# Patient Record
Sex: Female | Born: 1990 | Hispanic: No | Marital: Married | Smoking: Never smoker
Health system: Southern US, Community
[De-identification: ages and names within clinical notes are randomized; demographics above are authoritative.]

## PROBLEM LIST (undated history)

## (undated) ENCOUNTER — Inpatient Hospital Stay (HOSPITAL_COMMUNITY): Payer: Self-pay

## (undated) DIAGNOSIS — F329 Major depressive disorder, single episode, unspecified: Secondary | ICD-10-CM

## (undated) DIAGNOSIS — F99 Mental disorder, not otherwise specified: Secondary | ICD-10-CM

## (undated) DIAGNOSIS — F32A Depression, unspecified: Secondary | ICD-10-CM

## (undated) DIAGNOSIS — Z789 Other specified health status: Secondary | ICD-10-CM

## (undated) DIAGNOSIS — M419 Scoliosis, unspecified: Secondary | ICD-10-CM

## (undated) HISTORY — DX: Mental disorder, not otherwise specified: F99

## (undated) HISTORY — PX: NO PAST SURGERIES: SHX2092

## (undated) HISTORY — DX: Depression, unspecified: F32.A

## (undated) HISTORY — DX: Major depressive disorder, single episode, unspecified: F32.9

---
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
## 2009-04-21 ENCOUNTER — Inpatient Hospital Stay (HOSPITAL_COMMUNITY): Admission: AD | Admit: 2009-04-21 | Discharge: 2009-04-21 | Payer: Self-pay | Admitting: Obstetrics and Gynecology

## 2009-08-27 ENCOUNTER — Inpatient Hospital Stay (HOSPITAL_COMMUNITY): Admission: AD | Admit: 2009-08-27 | Discharge: 2009-08-28 | Payer: Self-pay | Admitting: Obstetrics and Gynecology

## 2009-09-27 ENCOUNTER — Inpatient Hospital Stay (HOSPITAL_COMMUNITY): Admission: AD | Admit: 2009-09-27 | Discharge: 2009-09-27 | Payer: Self-pay | Admitting: Obstetrics and Gynecology

## 2009-10-11 ENCOUNTER — Inpatient Hospital Stay (HOSPITAL_COMMUNITY): Admission: AD | Admit: 2009-10-11 | Discharge: 2009-10-11 | Payer: Self-pay | Admitting: Obstetrics and Gynecology

## 2009-10-17 ENCOUNTER — Inpatient Hospital Stay (HOSPITAL_COMMUNITY): Admission: AD | Admit: 2009-10-17 | Discharge: 2009-10-20 | Payer: Self-pay | Admitting: Obstetrics and Gynecology

## 2010-02-20 ENCOUNTER — Inpatient Hospital Stay (HOSPITAL_COMMUNITY)
Admission: AD | Admit: 2010-02-20 | Discharge: 2010-02-20 | Payer: Self-pay | Source: Home / Self Care | Admitting: Obstetrics & Gynecology

## 2010-06-12 LAB — CBC
HCT: 39.3 % (ref 36.0–46.0)
Hemoglobin: 13.3 g/dL (ref 12.0–15.0)
MCH: 31.8 pg (ref 26.0–34.0)
MCV: 94 fL (ref 78.0–100.0)
Platelets: 348 10*3/uL (ref 150–400)
RBC: 4.18 MIL/uL (ref 3.87–5.11)

## 2010-06-12 LAB — URINALYSIS, ROUTINE W REFLEX MICROSCOPIC
Hgb urine dipstick: NEGATIVE
Specific Gravity, Urine: 1.02 (ref 1.005–1.030)
Urobilinogen, UA: 0.2 mg/dL (ref 0.0–1.0)

## 2010-06-12 LAB — COMPREHENSIVE METABOLIC PANEL
Alkaline Phosphatase: 80 U/L (ref 39–117)
BUN: 9 mg/dL (ref 6–23)
CO2: 25 mEq/L (ref 19–32)
Chloride: 105 mEq/L (ref 96–112)
Creatinine, Ser: 0.74 mg/dL (ref 0.4–1.2)
GFR calc non Af Amer: >60 mL/min (ref >60)
Glucose, Bld: 82 mg/dL (ref 70–99)
Total Bilirubin: 0.6 mg/dL (ref 0.3–1.2)

## 2010-06-12 LAB — POCT PREGNANCY, URINE: Preg Test, Ur: NEGATIVE

## 2010-06-12 LAB — GC/CHLAMYDIA PROBE AMP, GENITAL: GC Probe Amp, Genital: NEGATIVE

## 2010-06-12 LAB — WET PREP, GENITAL: Trich, Wet Prep: NONE SEEN

## 2010-06-16 LAB — GLUCOSE, RANDOM: Glucose, Bld: 99 mg/dL (ref 70–99)

## 2010-06-16 LAB — RPR: RPR Ser Ql: NONREACTIVE

## 2010-06-16 LAB — CBC
Hemoglobin: 11.7 g/dL — ABNORMAL LOW (ref 12.0–15.0)
Hemoglobin: 13.2 g/dL (ref 12.0–15.0)
Platelets: 212 10*3/uL (ref 150–400)
Platelets: 270 10*3/uL (ref 150–400)
RBC: 3.58 MIL/uL — ABNORMAL LOW (ref 3.87–5.11)
RBC: 4.08 MIL/uL (ref 3.87–5.11)
WBC: 11.6 10*3/uL — ABNORMAL HIGH (ref 4.0–10.5)

## 2010-06-17 LAB — URINALYSIS, ROUTINE W REFLEX MICROSCOPIC
Bilirubin Urine: NEGATIVE
Glucose, UA: NEGATIVE mg/dL
Hgb urine dipstick: NEGATIVE
Specific Gravity, Urine: 1.02 (ref 1.005–1.030)
Urobilinogen, UA: 0.2 mg/dL (ref 0.0–1.0)

## 2010-06-17 LAB — URINE MICROSCOPIC-ADD ON

## 2010-06-17 LAB — STREP B DNA PROBE

## 2010-06-17 LAB — GC/CHLAMYDIA PROBE AMP, GENITAL: GC Probe Amp, Genital: NEGATIVE

## 2010-06-18 LAB — URINE MICROSCOPIC-ADD ON: RBC / HPF: NONE SEEN RBC/hpf (ref <3)

## 2010-06-18 LAB — RPR: RPR Ser Ql: NONREACTIVE

## 2010-06-18 LAB — DIFFERENTIAL
Basophils Relative: 0 % (ref 0–1)
Lymphocytes Relative: 23 % (ref 12–46)
Lymphs Abs: 3 10*3/uL (ref 0.7–4.0)
Monocytes Absolute: 0.8 10*3/uL (ref 0.1–1.0)
Monocytes Relative: 6 % (ref 3–12)
Neutro Abs: 9.2 10*3/uL — ABNORMAL HIGH (ref 1.7–7.7)
Neutrophils Relative %: 70 % (ref 43–77)

## 2010-06-18 LAB — URINALYSIS, ROUTINE W REFLEX MICROSCOPIC
Glucose, UA: NEGATIVE mg/dL
Ketones, ur: NEGATIVE mg/dL
Nitrite: NEGATIVE
Protein, ur: NEGATIVE mg/dL
pH: 6.5 (ref 5.0–8.0)

## 2010-06-18 LAB — CBC
Hemoglobin: 12.3 g/dL (ref 12.0–15.0)
MCHC: 33.3 g/dL (ref 30.0–36.0)
RBC: 3.87 MIL/uL (ref 3.87–5.11)
WBC: 13.2 10*3/uL — ABNORMAL HIGH (ref 4.0–10.5)

## 2010-06-18 LAB — ABO/RH: ABO/RH(D): A POS

## 2010-06-18 LAB — HEPATITIS B SURFACE ANTIGEN: Hepatitis B Surface Ag: NEGATIVE

## 2010-06-18 LAB — TYPE AND SCREEN: ABO/RH(D): A POS

## 2010-06-18 LAB — RUBELLA SCREEN: Rubella: 116.5 IU/mL — ABNORMAL HIGH

## 2011-01-17 ENCOUNTER — Inpatient Hospital Stay (HOSPITAL_COMMUNITY)
Admission: AD | Admit: 2011-01-17 | Discharge: 2011-01-18 | Disposition: A | Discharge status: Home / Self Care | Payer: Medicaid Other | Source: Ambulatory Visit | Attending: Obstetrics and Gynecology | Admitting: Obstetrics and Gynecology

## 2011-01-17 ENCOUNTER — Encounter (HOSPITAL_COMMUNITY): Payer: Self-pay | Admitting: *Deleted

## 2011-01-17 DIAGNOSIS — O99891 Other specified diseases and conditions complicating pregnancy: Secondary | ICD-10-CM | POA: Insufficient documentation

## 2011-01-17 DIAGNOSIS — R109 Unspecified abdominal pain: Secondary | ICD-10-CM | POA: Insufficient documentation

## 2011-01-17 DIAGNOSIS — O0930 Supervision of pregnancy with insufficient antenatal care, unspecified trimester: Secondary | ICD-10-CM | POA: Insufficient documentation

## 2011-01-17 NOTE — Progress Notes (Signed)
[facility]  AND THEY GAVE EDC..   TONIGHT  WORSE BECAUSE   COULD NOT EAT.   HAS NOT VOMITED.

## 2011-01-17 NOTE — Progress Notes (Signed)
PT SAYS  THIS  STARTED WED NIGHT - HURTS TO SIT - HURTS TO BREATHE- HAS SHARP PAIN  IN UPPER PART  OF STOMACH-   HAS LOWER CRAMPING.

## 2011-01-18 ENCOUNTER — Inpatient Hospital Stay (HOSPITAL_COMMUNITY): Payer: Medicaid Other

## 2011-01-18 ENCOUNTER — Encounter (HOSPITAL_COMMUNITY): Payer: Self-pay | Admitting: Obstetrics and Gynecology

## 2011-01-18 LAB — GC/CHLAMYDIA PROBE AMP, GENITAL
Chlamydia, DNA Probe: NEGATIVE
GC Probe Amp, Genital: NEGATIVE

## 2011-01-18 LAB — URINALYSIS, ROUTINE W REFLEX MICROSCOPIC
Bilirubin Urine: NEGATIVE
Glucose, UA: NEGATIVE mg/dL
Hgb urine dipstick: NEGATIVE
Specific Gravity, Urine: 1.025 (ref 1.005–1.030)

## 2011-01-18 LAB — WET PREP, GENITAL
Clue Cells Wet Prep HPF POC: NONE SEEN
Trich, Wet Prep: NONE SEEN
Yeast Wet Prep HPF POC: NONE SEEN

## 2011-01-18 MED ORDER — IBUPROFEN 100 MG/5ML PO SUSP
600.0000 mg | Freq: Once | ORAL | Status: DC
  Filled 2011-01-18: qty 30

## 2011-01-18 MED ORDER — ACETAMINOPHEN 160 MG/5ML PO SOLN
650.0000 mg | Freq: Once | ORAL | Status: AC
  Administered 2011-01-18: 650 mg via ORAL
  Filled 2011-01-18: qty 20.3

## 2011-01-18 MED ORDER — ONDANSETRON 8 MG PO TBDP
8.0000 mg | ORAL_TABLET | Freq: Once | ORAL | Status: AC
  Administered 2011-01-18: 8 mg via ORAL
  Filled 2011-01-18: qty 1

## 2011-01-18 MED ORDER — ACETAMINOPHEN 500 MG PO TABS
1000.0000 mg | ORAL_TABLET | Freq: Once | ORAL | Status: DC
  Filled 2011-01-18: qty 2

## 2011-01-18 MED ORDER — IBUPROFEN 400 MG/4ML IV SOLN: 800.0000 mg | Freq: Once | INTRAVENOUS | Status: DC

## 2011-01-18 MED ORDER — IBUPROFEN 600 MG PO TABS
600.0000 mg | ORAL_TABLET | Freq: Once | ORAL | Status: DC
  Filled 2011-01-18: qty 1

## 2011-01-18 MED ORDER — ONDANSETRON 8 MG PO TBDP: 8.0000 mg | ORAL_TABLET | Freq: Three times a day (TID) | ORAL | Status: AC | PRN

## 2011-01-18 NOTE — Progress Notes (Signed)
"  Since last night I have had lower abdominal pain & pressure.  I can't lay or stand without pain."

## 2011-01-21 NOTE — ED Provider Notes (Signed)
History     No chief complaint on file.  HPI Comments: Pt is a G2P1 at 13wks.  Pt arrived c/o all over abd pain but mostly lower pelvic that started last evening, also c/o N/V. Has not had appt at [facility] yet, has not tried anything for pain. Denies VB or D/C     History reviewed. No pertinent past medical history.  History reviewed. No pertinent past surgical history.  No family history on file.  History  Substance Use Topics  . Smoking status: Never Smoker   . Smokeless tobacco: Not on file  . Alcohol Use: No    Allergies:  Allergies  Allergen Reactions  . Peanut-Containing Drug Products Anaphylaxis    No prescriptions prior to admission    Review of Systems  Gastrointestinal: Positive for nausea, vomiting and abdominal pain.  All other systems reviewed and are negative.   Physical Exam   Pulse 77, temperature 98.4 F (36.9 C), temperature source Oral, height 4\' 11"  (1.499 m), weight 67.132 kg (148 lb), last menstrual period 10/15/2010.  Physical Exam  Constitutional: She is oriented to person, place, and time. She appears well-developed and well-nourished.  Cardiovascular: Normal rate.   Respiratory: Effort normal.  GI: Soft. Bowel sounds are normal. She exhibits no distension and no mass. There is tenderness. There is no rebound and no guarding.  Genitourinary: Vagina normal.  Musculoskeletal: Normal range of motion.  Neurological: She is alert and oriented to person, place, and time.  Skin: Skin is warm and dry.  Psychiatric: She has a normal mood and affect. Her behavior is normal.    MAU Course  Procedures    Assessment and Plan  13wks, no prenatal care as of yet. [place] FHR 172 otherwise normal Wet prep neg, cx closed Urine neg  Enc pt to get appt at [facility] Declined motrin Given liquid tylenol and zofran odt  Pt feels some relief RX for zofran given   Lorraine Beard M 01/21/2011, 12:35 PM

## 2011-02-01 LAB — OB RESULTS CONSOLE ABO/RH: RH Type: POSITIVE

## 2011-02-01 LAB — CBC
HCT: 40 % (ref 36–46)
Hemoglobin: 13.4 g/dL (ref 12.0–16.0)
Platelets: 351 10*3/uL (ref 150–399)

## 2011-02-01 LAB — OB RESULTS CONSOLE ANTIBODY SCREEN: Antibody Screen: NEGATIVE

## 2011-04-02 NOTE — L&D Delivery Note (Signed)
Delivery Note  At 0705, RN had difficulty tracing FHT's with cardio and pt c/o increased pressure, And I was called to BS. she was 9cm at that time, however, a forebag was AROM'd with clear fluid, and FSE placed, vtx was still at -3 station, pt was turned to her side, and over the next 30 min, started to feel urge to push, at 0730, pt was placed in stirrups and began pushing, vtx descended well  FHT's had variables, but overall reassuring     At 7:47 AM a viable female was delivered via Vaginal, Spontaneous Delivery (Presentation: ROA;  ).  APGAR: 8, 9; weight 7 lb 4.6 oz (3306 g).   Placenta status: Intact, Spontaneous.  Cord: 3 vessels with the following complications:   Anesthesia: Epidural  Episiotomy: None Lacerations: None Suture Repair: n/a Est. Blood Loss (mL): 200  Mom to postpartum.  Baby to nursery-stable.skin-skin Mom plans to BF Declines circumcision Dr Su Hilt notified  Malissa Hippo 08/05/2011, 2:39 PM

## 2011-05-31 ENCOUNTER — Encounter (INDEPENDENT_AMBULATORY_CARE_PROVIDER_SITE_OTHER): Payer: Medicaid Other | Admitting: Obstetrics and Gynecology

## 2011-05-31 ENCOUNTER — Encounter: Payer: Medicaid Other | Admitting: Obstetrics and Gynecology

## 2011-05-31 DIAGNOSIS — Z331 Pregnant state, incidental: Secondary | ICD-10-CM

## 2011-06-12 ENCOUNTER — Encounter (INDEPENDENT_AMBULATORY_CARE_PROVIDER_SITE_OTHER): Payer: Medicaid Other | Admitting: Obstetrics and Gynecology

## 2011-06-12 DIAGNOSIS — Z331 Pregnant state, incidental: Secondary | ICD-10-CM

## 2011-06-14 ENCOUNTER — Encounter: Payer: Medicaid Other | Admitting: Obstetrics and Gynecology

## 2011-06-24 ENCOUNTER — Encounter (INDEPENDENT_AMBULATORY_CARE_PROVIDER_SITE_OTHER): Payer: Medicaid Other | Admitting: Obstetrics and Gynecology

## 2011-06-24 DIAGNOSIS — Z3480 Encounter for supervision of other normal pregnancy, unspecified trimester: Secondary | ICD-10-CM

## 2011-07-02 ENCOUNTER — Encounter (HOSPITAL_COMMUNITY): Payer: Self-pay | Admitting: *Deleted

## 2011-07-02 ENCOUNTER — Encounter (INDEPENDENT_AMBULATORY_CARE_PROVIDER_SITE_OTHER): Payer: Medicaid Other | Admitting: Registered Nurse

## 2011-07-02 ENCOUNTER — Inpatient Hospital Stay (HOSPITAL_COMMUNITY)
Admission: AD | Admit: 2011-07-02 | Discharge: 2011-07-02 | Disposition: A | Discharge status: Home / Self Care | Payer: Medicaid Other | Source: Ambulatory Visit | Attending: Obstetrics and Gynecology | Admitting: Obstetrics and Gynecology

## 2011-07-02 DIAGNOSIS — B373 Candidiasis of vulva and vagina: Secondary | ICD-10-CM

## 2011-07-02 DIAGNOSIS — O4700 False labor before 37 completed weeks of gestation, unspecified trimester: Secondary | ICD-10-CM

## 2011-07-02 DIAGNOSIS — Z8739 Personal history of other diseases of the musculoskeletal system and connective tissue: Secondary | ICD-10-CM

## 2011-07-02 DIAGNOSIS — B3731 Acute candidiasis of vulva and vagina: Secondary | ICD-10-CM | POA: Insufficient documentation

## 2011-07-02 DIAGNOSIS — Z331 Pregnant state, incidental: Secondary | ICD-10-CM

## 2011-07-02 DIAGNOSIS — O2390 Unspecified genitourinary tract infection in pregnancy, unspecified trimester: Secondary | ICD-10-CM | POA: Insufficient documentation

## 2011-07-02 DIAGNOSIS — Z8659 Personal history of other mental and behavioral disorders: Secondary | ICD-10-CM

## 2011-07-02 HISTORY — DX: Other specified health status: Z78.9

## 2011-07-02 NOTE — MAU Note (Signed)
Was at dr's office, don't think leaking, but you are contracting (ongoing - 3/hr- ? Braxton hicks  Worse at night) and has watery d/c.

## 2011-07-02 NOTE — MAU Provider Note (Signed)
  History   Lorraine Beard is a 21 y.o. Black female who presents at 33.3 weeks per [facility] 08/16/11 for amnisure test.  She was worked-in at the office this afternoon to r/o LOF.  Seen at [facility] by Brett Albino (FNP), and negative pooling, negative fern, positive yeast, and given Terazol 3 Rx.  Pt was still convinced despite those tests, that she is leaking amniotic fluid, and she was brought to MAU for amnisure swab.  Pt denies any VB. Has occ'l ctxs.  Had reactive NST at office and cx "closed/long" by FNP's report.  FFN was also sent from office.  Pt denies UTI or PIH s/s.  No other c/o's.  Reports GFM.  Pt reports underwear have been "damp" today, but no gushes or saturation.   Pregnancy r/f: 1.  H/o PPD after last delivery for 3-4 months (no meds) 2.  H/o scoliosis  CSN: [id number]  Arrival date and time: 07/02/11 1752   None     No chief complaint on file.  HPI  OB History    Grav Para Term Preterm Abortions TAB SAB Ect Mult Living   2 1 1       1       Past Medical History  Diagnosis Date  . No pertinent past medical history     Past Surgical History  Procedure Date  . No past surgeries     Family History  Problem Relation Age of Onset  . Anesthesia problems Neg Hx   . Hypertension Mother   . Hypertension Father     History  Substance Use Topics  . Smoking status: Never Smoker   . Smokeless tobacco: Never Used  . Alcohol Use: No    Allergies:  Allergies  Allergen Reactions  . Peanut-Containing Drug Products Anaphylaxis  . Food Itching    Almond. Caused red bumps all over.    No prescriptions prior to admission    ROS--see history Physical Exam   Blood pressure 116/61, pulse 91, temperature 97.2 F (36.2 C), temperature source Oral, resp. rate 18, height 5' 1.5" (1.562 m), weight 79.833 kg (176 lb), last menstrual period 10/15/2010. .. Results for orders placed during the hospital encounter of 07/02/11 (from the past 24 hour(s))  AMNISURE RUPTURE OF MEMBRANE (ROM)      Status: Normal   Collection Time   07/02/11  7:05 PM      Component Value Range   Amnisure ROM NEGATIVE     Physical Exam  Constitutional: She is oriented to person, place, and time. She appears well-developed and well-nourished. No distress.  Cardiovascular: Normal rate.   Respiratory: Effort normal.  GI: Soft.       gravid  Genitourinary:       Pelvic exam deferred; On inspection:  Pearly thin white d/c at introitus and WNL; no odor.    Neurological: She is alert and oriented to person, place, and time.  Skin: Skin is warm and dry.    MAU Course  Procedures 1.  amnisure  Assessment and Plan  1.  IUP at 33.3  2.  Negative amnisure and no s/s of rupture 3. Yeast vaginitis (dx'd at office today) 4.  No s/s of PTL  1.  D/c home w/ PTL and leakage precautions 2.  F/u next Mon 07/08/11 at [facility] as scheduled or prn any questions or concerns 3.  Start Terazol tonight  Shontay Wallner H 07/02/2011, 7:59 PM

## 2011-07-02 NOTE — Discharge Instructions (Signed)
Candidal Vulvovaginitis Candidal vulvovaginitis is an infection of the vagina and vulva. The vulva is the skin around the opening of the vagina. This may cause itching and discomfort in and around the vagina.  HOME CARE  Only take medicine as told by your doctor.   Do not have sex (intercourse) until the infection is healed or as told by your doctor.   Practice safe sex.   Tell your sex partner about your infection.   Do not douche or use tampons.   Wear cotton underwear. Do not wear tight pants or panty hose.   Eat yogurt. This may help treat and prevent yeast infections.  GET HELP RIGHT AWAY IF:   You have a fever.   Your problems get worse during treatment or do not get better in 3 days.   You have discomfort, irritation, or itching in your vagina or vulva area.   You have pain after sex.   You start to get belly (abdominal) pain.  MAKE SURE YOU:  Understand these instructions.   Will watch your condition.   Will get help right away if you are not doing well or get worse.  Document Released: 06/14/2008 Document Revised: 03/07/2011 Document Reviewed: 06/14/2008 Exit[employer] Patient Information 2012 [employer]. 

## 2011-07-03 DIAGNOSIS — Z8739 Personal history of other diseases of the musculoskeletal system and connective tissue: Secondary | ICD-10-CM

## 2011-07-03 DIAGNOSIS — Z8659 Personal history of other mental and behavioral disorders: Secondary | ICD-10-CM

## 2011-07-08 ENCOUNTER — Encounter (INDEPENDENT_AMBULATORY_CARE_PROVIDER_SITE_OTHER): Payer: Medicaid Other | Admitting: Obstetrics and Gynecology

## 2011-07-08 DIAGNOSIS — Z331 Pregnant state, incidental: Secondary | ICD-10-CM

## 2011-07-09 ENCOUNTER — Other Ambulatory Visit: Payer: Self-pay

## 2011-07-09 DIAGNOSIS — O26849 Uterine size-date discrepancy, unspecified trimester: Secondary | ICD-10-CM

## 2011-07-24 DIAGNOSIS — O0930 Supervision of pregnancy with insufficient antenatal care, unspecified trimester: Secondary | ICD-10-CM

## 2011-07-25 ENCOUNTER — Other Ambulatory Visit: Payer: Self-pay | Admitting: Obstetrics and Gynecology

## 2011-07-25 ENCOUNTER — Encounter: Payer: Self-pay | Admitting: Obstetrics and Gynecology

## 2011-07-25 ENCOUNTER — Ambulatory Visit (INDEPENDENT_AMBULATORY_CARE_PROVIDER_SITE_OTHER): Payer: Medicaid Other

## 2011-07-25 ENCOUNTER — Ambulatory Visit (INDEPENDENT_AMBULATORY_CARE_PROVIDER_SITE_OTHER): Payer: Medicaid Other | Admitting: Obstetrics and Gynecology

## 2011-07-25 VITALS — BP 98/60 | Wt 177.0 lb

## 2011-07-25 DIAGNOSIS — Z331 Pregnant state, incidental: Secondary | ICD-10-CM

## 2011-07-25 DIAGNOSIS — O26849 Uterine size-date discrepancy, unspecified trimester: Secondary | ICD-10-CM

## 2011-07-25 LAB — US OB FOLLOW UP

## 2011-07-25 NOTE — Progress Notes (Signed)
Sono: EFW  7 lbs 4 oz  83%  Vertex  AFI normal  GBS, GC, chlamydia done

## 2011-07-28 LAB — OB RESULTS CONSOLE GBS: GBS: NEGATIVE

## 2011-07-28 LAB — CULTURE, BETA STREP (GROUP B ONLY)

## 2011-07-29 ENCOUNTER — Encounter (HOSPITAL_COMMUNITY): Payer: Self-pay | Admitting: *Deleted

## 2011-07-29 ENCOUNTER — Telehealth: Payer: Self-pay | Admitting: Obstetrics and Gynecology

## 2011-07-29 ENCOUNTER — Inpatient Hospital Stay (HOSPITAL_COMMUNITY)
Admission: AD | Admit: 2011-07-29 | Discharge: 2011-07-29 | Disposition: A | Discharge status: Home / Self Care | Payer: Medicaid Other | Source: Ambulatory Visit | Attending: Obstetrics and Gynecology | Admitting: Obstetrics and Gynecology

## 2011-07-29 DIAGNOSIS — O479 False labor, unspecified: Secondary | ICD-10-CM

## 2011-07-29 DIAGNOSIS — O368190 Decreased fetal movements, unspecified trimester, not applicable or unspecified: Secondary | ICD-10-CM | POA: Insufficient documentation

## 2011-07-29 NOTE — MAU Provider Note (Signed)
History   21 yo G2P1001 at 75 3/7 weeks presented after calling office this am with decreased FM, which improved over the morning, but now c/o low pelvic pressure and cramping with FM.  Denies leaking or bleeding, now reports normal FM. Had [place] last week with EFW and fluid WNL, vtx presentation--done for S>D.  Pregnancy remarkable for: Hx pp depression Slightly late to care GBS negative  Chief Complaint  Patient presents with  . Abdominal Pain    OB History    Grav Para Term Preterm Abortions TAB SAB Ect Mult Living   2 1 1       1       Past Medical History  Diagnosis Date  . No pertinent past medical history   . Mental disorder   . Depression     Past Surgical History  Procedure Date  . No past surgeries     Family History  Problem Relation Age of Onset  . Anesthesia problems Neg Hx   . Hearing loss Neg Hx   . Hypertension Mother   . Diabetes Mother   . Kidney disease Mother   . Hypertension Father   . Alcohol abuse Father   . Alcohol abuse Maternal Grandmother   . Stroke Paternal Grandmother     History  Substance Use Topics  . Smoking status: Never Smoker   . Smokeless tobacco: Never Used  . Alcohol Use: 0.6 oz/week    1 Glasses of wine per week    Allergies:  Allergies  Allergen Reactions  . Peanut-Containing Drug Products Anaphylaxis  . Food Itching    Almond. Caused red bumps all over.    Prescriptions prior to admission  Medication Sig Dispense Refill  . Prenatal Vit-Fe Fumarate-FA (PRENATAL MULTIVITAMIN) TABS Take 1 tablet by mouth daily.         Physical Exam   Blood pressure 109/63, pulse 100, temperature 98 F (36.7 C), temperature source Oral, resp. rate 20, last menstrual period 10/15/2010, unknown if currently breastfeeding.  Chest clear Heart RRR without murmur Abd gravid, NT Pelvic--cervix closed, long, soft, vtx -2. FHR reactive, no decels UCs one in 20 minutes, occasional mild irritability.  ED Course  IUP at 37 3/7  weeks Uterine activity at term Reactive FHR  Plan: D/C home with labor precautions reviewed. Keep scheduled appointment at [facility] on Thursday, or call prn.  Nigel Bridgeman, CNM, [place] 07/29/11 2:30pm

## 2011-07-29 NOTE — Discharge Instructions (Signed)

## 2011-07-29 NOTE — MAU Note (Signed)
Decreased movement during the night, now moving- but having pain with movement.

## 2011-07-29 NOTE — Telephone Encounter (Signed)
Call from pt ,c/o of decreased fetal movement some abd cramping no blding or leaking of fluid.Consulted with Jearld Lesch CNM she wants pt to eat and then drink2-3 glasses of water or cold beverage and do fetal kick count for 2 hours and call with update pt is agreeable with plan I will talk with her in about 2 hours which wil be about12:30p Sheran Lawless RN

## 2011-07-29 NOTE — Telephone Encounter (Signed)
Pt called back 12 fetal movements in last two hours but now has increased vag and abd pressure come with fetal movement and about every 10 to 20 mintues getting worse I spoke with Nigel Bridgeman CNM regarding new c/o pt to hospital for evaluation pt agreeable with plan Duanne Guess.

## 2011-07-29 NOTE — Telephone Encounter (Signed)
Routed to [name]/has received

## 2011-08-01 ENCOUNTER — Encounter: Payer: Self-pay | Admitting: Obstetrics and Gynecology

## 2011-08-01 ENCOUNTER — Ambulatory Visit (INDEPENDENT_AMBULATORY_CARE_PROVIDER_SITE_OTHER): Payer: Medicaid Other | Admitting: Obstetrics and Gynecology

## 2011-08-01 VITALS — BP 102/58 | Wt 180.0 lb

## 2011-08-01 DIAGNOSIS — Z331 Pregnant state, incidental: Secondary | ICD-10-CM

## 2011-08-01 NOTE — Progress Notes (Signed)
Lots of pressure. No regular contractions. More mucousy discharge. No bleeding or LOF

## 2011-08-01 NOTE — Progress Notes (Signed)
Pt. Stated she thinks she lost her mucous plug.

## 2011-08-04 ENCOUNTER — Inpatient Hospital Stay (HOSPITAL_COMMUNITY)
Admission: AD | Admit: 2011-08-04 | Discharge: 2011-08-07 | DRG: 775 | Disposition: A | Discharge status: Home / Self Care | Payer: Medicaid Other | Source: Ambulatory Visit | Attending: Obstetrics and Gynecology | Admitting: Obstetrics and Gynecology

## 2011-08-04 ENCOUNTER — Encounter (HOSPITAL_COMMUNITY): Payer: Self-pay | Admitting: *Deleted

## 2011-08-04 DIAGNOSIS — Z8739 Personal history of other diseases of the musculoskeletal system and connective tissue: Secondary | ICD-10-CM

## 2011-08-04 DIAGNOSIS — IMO0002 Reserved for concepts with insufficient information to code with codable children: Secondary | ICD-10-CM

## 2011-08-04 DIAGNOSIS — O4290 Premature rupture of membranes, unspecified as to length of time between rupture and onset of labor, unspecified weeks of gestation: Principal | ICD-10-CM | POA: Diagnosis present

## 2011-08-04 DIAGNOSIS — Z8659 Personal history of other mental and behavioral disorders: Secondary | ICD-10-CM

## 2011-08-04 DIAGNOSIS — O9903 Anemia complicating the puerperium: Secondary | ICD-10-CM | POA: Diagnosis not present

## 2011-08-04 DIAGNOSIS — M4120 Other idiopathic scoliosis, site unspecified: Secondary | ICD-10-CM | POA: Diagnosis present

## 2011-08-04 DIAGNOSIS — O99891 Other specified diseases and conditions complicating pregnancy: Secondary | ICD-10-CM

## 2011-08-04 DIAGNOSIS — O99892 Other specified diseases and conditions complicating childbirth: Secondary | ICD-10-CM | POA: Diagnosis present

## 2011-08-04 DIAGNOSIS — D649 Anemia, unspecified: Secondary | ICD-10-CM | POA: Diagnosis not present

## 2011-08-04 DIAGNOSIS — O0930 Supervision of pregnancy with insufficient antenatal care, unspecified trimester: Secondary | ICD-10-CM | POA: Diagnosis present

## 2011-08-04 NOTE — MAU Note (Signed)
PT SAYS AT 2300- SHE WENT  TO B-ROOM - FELT A POP  THEN MUCUS CAME OUT  THEN IT TURNED BROWN.     IN  TRIAGE -  PAD HAS  ON  2 RED/ BROWN   DIME SIZE  SPOTS .   HAD UC BEFORE   POSSIBLE  SROM.    CALLED [facility]- BUT LEFT HOME BEFORE THEY CAL;LED BACK.

## 2011-08-04 NOTE — MAU Note (Signed)
VE LAST Thursday IN OFFICE  1 CM

## 2011-08-04 NOTE — MAU Note (Signed)
Pt felt a "pop" and noticed very thick, brown, mucous with mild contractions.

## 2011-08-05 ENCOUNTER — Inpatient Hospital Stay (HOSPITAL_COMMUNITY): Payer: Medicaid Other | Admitting: Anesthesiology

## 2011-08-05 ENCOUNTER — Encounter (HOSPITAL_COMMUNITY): Payer: Self-pay | Admitting: *Deleted

## 2011-08-05 ENCOUNTER — Encounter (HOSPITAL_COMMUNITY): Payer: Self-pay | Admitting: Anesthesiology

## 2011-08-05 LAB — CBC
HCT: 35.7 % — ABNORMAL LOW (ref 36.0–46.0)
MCHC: 32.5 g/dL (ref 30.0–36.0)
MCV: 92.5 fL (ref 78.0–100.0)
RDW: 13.1 % (ref 11.5–15.5)
WBC: 13.1 10*3/uL — ABNORMAL HIGH (ref 4.0–10.5)

## 2011-08-05 MED ORDER — SODIUM CHLORIDE 0.9 % IJ SOLN: 3.0000 mL | Freq: Two times a day (BID) | INTRAMUSCULAR | Status: DC

## 2011-08-05 MED ORDER — LIDOCAINE HCL (PF) 1 % IJ SOLN
30.0000 mL | INTRAMUSCULAR | Status: DC | PRN
  Filled 2011-08-05: qty 30

## 2011-08-05 MED ORDER — OXYTOCIN BOLUS FROM INFUSION
500.0000 mL | Freq: Once | INTRAVENOUS | Status: DC
  Filled 2011-08-05: qty 500

## 2011-08-05 MED ORDER — HYDROXYZINE HCL 50 MG PO TABS: 50.0000 mg | ORAL_TABLET | Freq: Four times a day (QID) | ORAL | Status: DC | PRN

## 2011-08-05 MED ORDER — LACTATED RINGERS IV SOLN
INTRAVENOUS | Status: DC
  Administered 2011-08-05 (×2): via INTRAVENOUS

## 2011-08-05 MED ORDER — ONDANSETRON HCL 4 MG/2ML IJ SOLN
4.0000 mg | Freq: Four times a day (QID) | INTRAMUSCULAR | Status: DC | PRN
  Administered 2011-08-05: 4 mg via INTRAVENOUS
  Filled 2011-08-05 (×2): qty 2

## 2011-08-05 MED ORDER — LIDOCAINE HCL (PF) 1 % IJ SOLN
INTRAMUSCULAR | Status: DC | PRN
  Administered 2011-08-05 (×2): 8 mL

## 2011-08-05 MED ORDER — SENNOSIDES-DOCUSATE SODIUM 8.6-50 MG PO TABS
2.0000 | ORAL_TABLET | Freq: Every day | ORAL | Status: DC
  Administered 2011-08-05: 1 via ORAL
  Administered 2011-08-06: 2 via ORAL

## 2011-08-05 MED ORDER — TERBUTALINE SULFATE 1 MG/ML IJ SOLN: 0.2500 mg | Freq: Once | INTRAMUSCULAR | Status: DC | PRN

## 2011-08-05 MED ORDER — ZOLPIDEM TARTRATE 5 MG PO TABS: 5.0000 mg | ORAL_TABLET | Freq: Every evening | ORAL | Status: DC | PRN

## 2011-08-05 MED ORDER — PHENYLEPHRINE 40 MCG/ML (10ML) SYRINGE FOR IV PUSH (FOR BLOOD PRESSURE SUPPORT)
80.0000 ug | PREFILLED_SYRINGE | INTRAVENOUS | Status: DC | PRN
  Filled 2011-08-05 (×2): qty 5

## 2011-08-05 MED ORDER — OXYTOCIN 20 UNITS IN LACTATED RINGERS INFUSION - SIMPLE
1.0000 m[IU]/min | INTRAVENOUS | Status: DC
  Administered 2011-08-05: 1 m[IU]/min via INTRAVENOUS
  Filled 2011-08-05: qty 1000

## 2011-08-05 MED ORDER — IBUPROFEN 600 MG PO TABS: 600.0000 mg | ORAL_TABLET | Freq: Four times a day (QID) | ORAL | Status: DC | PRN

## 2011-08-05 MED ORDER — LANOLIN HYDROUS EX OINT: TOPICAL_OINTMENT | CUTANEOUS | Status: DC | PRN

## 2011-08-05 MED ORDER — ACETAMINOPHEN 325 MG PO TABS: 650.0000 mg | ORAL_TABLET | ORAL | Status: DC | PRN

## 2011-08-05 MED ORDER — OXYTOCIN 20 UNITS IN LACTATED RINGERS INFUSION - SIMPLE
125.0000 mL/h | Freq: Once | INTRAVENOUS | Status: AC
  Administered 2011-08-05: 125 mL/h via INTRAVENOUS

## 2011-08-05 MED ORDER — BUTORPHANOL TARTRATE 2 MG/ML IJ SOLN
1.0000 mg | INTRAMUSCULAR | Status: DC | PRN
  Administered 2011-08-05: 1 mg via INTRAVENOUS
  Filled 2011-08-05: qty 1

## 2011-08-05 MED ORDER — ONDANSETRON HCL 4 MG PO TABS: 4.0000 mg | ORAL_TABLET | ORAL | Status: DC | PRN

## 2011-08-05 MED ORDER — CITRIC ACID-SODIUM CITRATE 334-500 MG/5ML PO SOLN: 30.0000 mL | ORAL | Status: DC | PRN

## 2011-08-05 MED ORDER — MEASLES, MUMPS & RUBELLA VAC ~~LOC~~ INJ
0.5000 mL | INJECTION | Freq: Once | SUBCUTANEOUS | Status: DC
  Filled 2011-08-05: qty 0.5

## 2011-08-05 MED ORDER — OXYCODONE-ACETAMINOPHEN 5-325 MG PO TABS: 1.0000 | ORAL_TABLET | ORAL | Status: DC | PRN

## 2011-08-05 MED ORDER — IBUPROFEN 100 MG/5ML PO SUSP
600.0000 mg | Freq: Four times a day (QID) | ORAL | Status: DC
  Administered 2011-08-05 – 2011-08-07 (×8): 600 mg via ORAL
  Filled 2011-08-05 (×9): qty 30

## 2011-08-05 MED ORDER — LACTATED RINGERS IV SOLN
500.0000 mL | Freq: Once | INTRAVENOUS | Status: AC
  Administered 2011-08-05: 1000 mL via INTRAVENOUS

## 2011-08-05 MED ORDER — ONDANSETRON HCL 4 MG/2ML IJ SOLN: 4.0000 mg | INTRAMUSCULAR | Status: DC | PRN

## 2011-08-05 MED ORDER — PRENATAL MULTIVITAMIN CH
1.0000 | ORAL_TABLET | Freq: Every day | ORAL | Status: DC
  Filled 2011-08-05: qty 1

## 2011-08-05 MED ORDER — IBUPROFEN 600 MG PO TABS
600.0000 mg | ORAL_TABLET | Freq: Four times a day (QID) | ORAL | Status: DC
  Filled 2011-08-05: qty 1

## 2011-08-05 MED ORDER — DIBUCAINE 1 % RE OINT: 1.0000 "application " | TOPICAL_OINTMENT | RECTAL | Status: DC | PRN

## 2011-08-05 MED ORDER — WITCH HAZEL-GLYCERIN EX PADS: 1.0000 "application " | MEDICATED_PAD | CUTANEOUS | Status: DC | PRN

## 2011-08-05 MED ORDER — DIPHENHYDRAMINE HCL 25 MG PO CAPS: 25.0000 mg | ORAL_CAPSULE | Freq: Four times a day (QID) | ORAL | Status: DC | PRN

## 2011-08-05 MED ORDER — HYDROXYZINE HCL 50 MG/ML IM SOLN: 50.0000 mg | Freq: Four times a day (QID) | INTRAMUSCULAR | Status: DC | PRN

## 2011-08-05 MED ORDER — PHENYLEPHRINE 40 MCG/ML (10ML) SYRINGE FOR IV PUSH (FOR BLOOD PRESSURE SUPPORT): 80.0000 ug | PREFILLED_SYRINGE | INTRAVENOUS | Status: DC | PRN

## 2011-08-05 MED ORDER — DIPHENHYDRAMINE HCL 50 MG/ML IJ SOLN: 12.5000 mg | INTRAMUSCULAR | Status: DC | PRN

## 2011-08-05 MED ORDER — TETANUS-DIPHTH-ACELL PERTUSSIS 5-2.5-18.5 LF-MCG/0.5 IM SUSP
0.5000 mL | Freq: Once | INTRAMUSCULAR | Status: AC
  Administered 2011-08-06: 0.5 mL via INTRAMUSCULAR

## 2011-08-05 MED ORDER — COMPLETENATE 29-1 MG PO CHEW
1.0000 | CHEWABLE_TABLET | Freq: Every day | ORAL | Status: DC
Start: 2011-08-05 — End: 2011-08-07
  Administered 2011-08-05 – 2011-08-07 (×3): 1 via ORAL
  Filled 2011-08-05 (×3): qty 1

## 2011-08-05 MED ORDER — FENTANYL 2.5 MCG/ML BUPIVACAINE 1/10 % EPIDURAL INFUSION (WH - ANES)
INTRAMUSCULAR | Status: DC | PRN
  Administered 2011-08-05: 14 mL/h via EPIDURAL

## 2011-08-05 MED ORDER — FENTANYL 2.5 MCG/ML BUPIVACAINE 1/10 % EPIDURAL INFUSION (WH - ANES)
14.0000 mL/h | INTRAMUSCULAR | Status: DC
  Filled 2011-08-05 (×2): qty 60

## 2011-08-05 MED ORDER — EPHEDRINE 5 MG/ML INJ
10.0000 mg | INTRAVENOUS | Status: DC | PRN
  Filled 2011-08-05 (×2): qty 4

## 2011-08-05 MED ORDER — SODIUM CHLORIDE 0.9 % IV SOLN: 250.0000 mL | INTRAVENOUS | Status: DC | PRN

## 2011-08-05 MED ORDER — EPHEDRINE 5 MG/ML INJ: 10.0000 mg | INTRAVENOUS | Status: DC | PRN

## 2011-08-05 MED ORDER — SIMETHICONE 80 MG PO CHEW: 80.0000 mg | CHEWABLE_TABLET | ORAL | Status: DC | PRN

## 2011-08-05 MED ORDER — LACTATED RINGERS IV SOLN: 500.0000 mL | INTRAVENOUS | Status: DC | PRN

## 2011-08-05 MED ORDER — FLEET ENEMA 7-19 GM/118ML RE ENEM: 1.0000 | ENEMA | RECTAL | Status: DC | PRN

## 2011-08-05 MED ORDER — SODIUM CHLORIDE 0.9 % IJ SOLN: 3.0000 mL | INTRAMUSCULAR | Status: DC | PRN

## 2011-08-05 MED ORDER — BENZOCAINE-MENTHOL 20-0.5 % EX AERO
1.0000 "application " | INHALATION_SPRAY | CUTANEOUS | Status: DC | PRN
  Administered 2011-08-05: 1 via TOPICAL
  Filled 2011-08-05: qty 56

## 2011-08-05 NOTE — H&P (Signed)
Lorraine Beard is a 21y.o. BF who presents unannounced at 38.3 weeks for r/o ROM.  Pt states at 2300- SHE WENT TO BR - FELT A POP THEN MUCUS CAME OUT THEN IT TURNED BROWN. Triage RN reports pt's pad had 2 RED/ BROWN DIME SIZE SPOTS . Pt reports irregular mild ctxs off and on last few days. No watery gushes. GFM noted. No UTI or PIH s/s. Emesis x1 yesterday. No other GI or resp c/o's, but states she just hasn't felt well today. Went "shopping" yesterday and was on her feet for a long time. Reports cx "1cm" at her office visit this past week.   Pt started prenatal care around 15 weeks.  She reported certain LMP, but EDC changed based on a 15 week u/s to 08/16/11.  Pt's pregnancy has otherwise been uncomplicated.  She had a normal anatomy scan and Quad screen.  Normal pregnancy discomforts.  She has struggled w/ heartburn and constipation.  She thought she might be leaking fluid around 07/02/11, and was seen in MAU and amnisure negative.  She has been measuring S>D since around 32 weeks.    OB Hx: G1=SVD '11; Female=8+6 at 37 weeks; IOL; "[name]" G2=current   Maternal Medical History:  Reason for admission: Reason for admission: rupture of membranes.  Contractions: Onset was 3-5 hours ago.   Frequency: irregular.   Perceived severity is mild.    Fetal activity: Perceived fetal activity is normal.   Last perceived fetal movement was within the past hour.    Prenatal complications: Pregnancy r/f:  1. H/o PPD after last delivery for 3-4 months (no meds)  2. H/o scoliosis  3. Gerd  4. LTC     OB History    Grav Para Term Preterm Abortions TAB SAB Ect Mult Living   2 1 1       1      Past Medical History  Diagnosis Date  . No pertinent past medical history   . Mental disorder   . Depression    Past Surgical History  Procedure Date  . No past surgeries    Family History: family history includes Alcohol abuse in her father and maternal grandmother; Diabetes in her mother; Hypertension in her  father and mother; Kidney disease in her mother; and Stroke in her paternal grandmother.  There is no history of Anesthesia problems and Hearing loss. Social History:  reports that she has never smoked. She has never used smokeless tobacco. She reports that she drinks about .6 ounces of alcohol per week. She reports that she does not use illicit drugs.Pt is a SAHM, and has HS education.  Her husband, "Lorraine Beard" is a Location manager, and has had 14 yrs of education.    Review of Systems  Constitutional: Negative.   HENT: Negative.   Eyes: Negative.   Cardiovascular: Negative.   Gastrointestinal: Positive for vomiting.       Emesis x1 yesterday  Genitourinary: Negative.   Skin: Negative.   Neurological: Negative.     Maternal Exam:  Uterine Assessment: Contraction strength is mild.  Contraction frequency is irregular.  UC's q 5-50min  Abdomen: Patient reports no abdominal tenderness. Fetal presentation: vertex Bedside u/s to confirm vtx presentation  Introitus: Normal vulva. Normal vagina.  Ferning test: not done.  Nitrazine test: not done. Amniotic fluid character: clear.  Pelvis: adequate for delivery.   Cervix: Cervix evaluated by sterile speculum exam and digital exam.     Fetal Exam Fetal Monitor Review: Mode: ultrasound.  Baseline rate: 135.  Variability: moderate (6-25 bpm).   Pattern: accelerations present and no decelerations.    Fetal State Assessment: Category I - tracings are normal.     Physical Exam  Blood pressure 113/65, pulse 90, temperature 97.9 F (36.6 C), temperature source Oral, height 5' (1.524 m), weight 81.761 kg (180 lb 4 oz), last menstrual period 10/15/2010, unknown if currently breastfeeding.  Physical Exam  Constitutional: She is oriented to person, place, and time. She appears well-developed and well-nourished. No distress.  Cardiovascular: Normal rate.   Respiratory: Effort normal.  GI: Soft.       gravid  Genitourinary:        SSE:  Neg pooling; large long piece of mucous plug at introitus and removed during exam. Cx:  2+/long/high/ posterior; medium soft.   Scant white d/c on vaginal walls otherwise.  Because of high station, difficulty palpating presentation, and/or membranes.   Musculoskeletal: She exhibits edema.       Mild generalized edema BLE  Neurological: She is alert and oriented to person, place, and time.  Skin: Skin is warm and dry.  Psychiatric: She has a normal mood and affect. Her behavior is normal. Judgment and thought content normal.  .. Results for orders placed during the hospital encounter of 08/04/11 (from the past 24 hour(s))  AMNISURE RUPTURE OF MEMBRANE (ROM)     Status: Normal   Collection Time   08/05/11 12:04 AM      Component Value Range   Amnisure ROM POSITIVE    CBC     Status: Abnormal   Collection Time   08/05/11  1:51 AM      Component Value Range   WBC 13.1 (*) 4.0 - 10.5 (K/uL)   RBC 3.86 (*) 3.87 - 5.11 (MIL/uL)   Hemoglobin 11.6 (*) 12.0 - 15.0 (g/dL)   HCT 16.1 (*) 09.6 - 46.0 (%)   MCV 92.5  78.0 - 100.0 (fL)   MCH 30.1  26.0 - 34.0 (pg)   MCHC 32.5  30.0 - 36.0 (g/dL)   RDW 04.5  40.9 - 81.1 (%)   Platelets 260  150 - 400 (K/uL)    Prenatal labs: ABO, Rh: A/Positive/-- (11/02 0000) Antibody: Negative (11/02 0000) Rubella: Immune (11/02 0000) RPR: Nonreactive (11/02 0000)  HBsAg: Negative (11/02 0000)  HIV: Non-reactive (11/02 0000)  GBS: Negative (04/28 0000)  [place] screen negative Quad screen:  Negative 1hr gtt=95  Assessment/Plan: 1.  38.3 weeks 2.  Positive amnisure; but no clinical s/s of rupture 3.  Early vs latent labor 4.  GBS neg  1.  Admit to [facility] w/ Dr. Estanislado Pandy as attending w/ routine L&D orders 2.  Will do expectant management for now, then recheck cx in a few hrs, and if no change, will start Pitocin. 3.  Labor support as needed; pt plans epidural in active labor 4.  C/w MD prn 5.  Anticipate SVD  Lorraine Beard H 08/05/2011, 7:19 AM (late  entry)

## 2011-08-05 NOTE — Anesthesia Procedure Notes (Signed)
Epidural Patient location during procedure: OB Start time: 08/05/2011 4:48 AM End time: 08/05/2011 4:54 AM Reason for block: procedure for pain  Staffing Anesthesiologist: Sandrea Hughs  Preanesthetic Checklist Completed: patient identified, site marked, surgical consent, pre-op evaluation, timeout performed, IV checked, risks and benefits discussed and monitors and equipment checked  Epidural Patient position: sitting Prep: site prepped and draped and DuraPrep Patient monitoring: continuous pulse ox and blood pressure Approach: midline Injection technique: LOR air  Needle:  Needle type: Tuohy  Needle gauge: 17 G Needle length: 9 cm Needle insertion depth: 8 cm Catheter type: closed end flexible Catheter size: 19 Gauge Catheter at skin depth: 12 cm Test dose: negative and Other  Assessment Sensory level: T10 Events: blood not aspirated, injection not painful, no injection resistance, negative IV test and paresthesia  Additional Notes R leg x 1

## 2011-08-05 NOTE — Anesthesia Postprocedure Evaluation (Signed)
  Anesthesia Post Note  Patient: Lorraine Beard  Procedure(s) Performed: * No procedures listed *  Anesthesia type: Epidural  Patient location: Mother/Baby  Post pain: Pain level controlled  Post assessment: Post-op Vital signs reviewed  Last Vitals:  Filed Vitals:   08/05/11 1445  BP: 108/44  Pulse: 65  Temp: 36.9 C  Resp: 18    Post vital signs: Reviewed  Level of consciousness:alert  Complications: No apparent anesthesia complications

## 2011-08-05 NOTE — Progress Notes (Signed)
UR chart review completed.  

## 2011-08-05 NOTE — Progress Notes (Signed)
Subjective: Checked pt's cervix around 0430, and slightly more effaced, but still 2cm.  Pt had received 1 dose of stadol a little after 0200, and wanting additional pain relief.   Pt received epidural just before 0500, and is now comfortable, and sleeping soundly.  Her pitocin was started at [facility] for augmentation s/p PROM.  Pt's husband remains at bedside.    Objective: BP 117/73  Pulse 84  Temp(Src) 97.9 F (36.6 C) (Oral)  Resp 20  Ht 5' (1.524 m)  Wt 81.761 kg (180 lb 4 oz)  BMI 35.20 kg/m2  SpO2 98%  LMP 10/15/2010  Breastfeeding? Unknown      FHT:  FHR: 130 bpm, variability: moderate,  accelerations:  Present,  decelerations:  Absent UC:   regular, every 2-5 minutes SVE:   Dilation: 2 Effacement (%): 60 Station: -3 Exam by:: h Gelila Well cnm  Labs: Lab Results  Component Value Date   WBC 13.1* 08/05/2011   HGB 11.6* 08/05/2011   HCT 35.7* 08/05/2011   MCV 92.5 08/05/2011   PLT 260 08/05/2011    Assessment / Plan: 1.  38.3  2.  SROM at 2300  3.  latent labor vs PROM  4.  GBS neg  Labor: Pitocin started secondary to no cervical change Preeclampsia:  no signs or symptoms of toxicity Fetal Wellbeing:  Category I Pain Control:  Epidural I/D:  n/a Anticipated MOD:  NSVD 1.  IUPC prn 2.  C/w MD prn Arael Piccione H 08/05/2011, 6:31 AM

## 2011-08-05 NOTE — Anesthesia Preprocedure Evaluation (Signed)
Anesthesia Evaluation  Patient identified by MRN, date of birth, ID band Patient awake    Reviewed: Allergy & Precautions, H&P , NPO status , Patient's Chart, lab work & pertinent test results  Airway Mallampati: II TM Distance: >3 FB Neck ROM: full    Dental No notable dental hx.    Pulmonary neg pulmonary ROS,    Pulmonary exam normal       Cardiovascular negative cardio ROS      Neuro/Psych PSYCHIATRIC DISORDERS Depression negative neurological ROS     GI/Hepatic negative GI ROS, Neg liver ROS,   Endo/Other  Morbid obesity  Renal/GU negative Renal ROS  negative genitourinary   Musculoskeletal negative musculoskeletal ROS (+)   Abdominal Normal abdominal exam  (+)   Peds negative pediatric ROS (+)  Hematology negative hematology ROS (+)   Anesthesia Other Findings   Reproductive/Obstetrics (+) Pregnancy                           Anesthesia Physical Anesthesia Plan  ASA: III  Anesthesia Plan: Epidural   Post-op Pain Management:    Induction:   Airway Management Planned:   Additional Equipment:   Intra-op Plan:   Post-operative Plan:   Informed Consent: I have reviewed the patients History and Physical, chart, labs and discussed the procedure including the risks, benefits and alternatives for the proposed anesthesia with the patient or authorized representative who has indicated his/her understanding and acceptance.     Plan Discussed with:   Anesthesia Plan Comments:         Anesthesia Quick Evaluation

## 2011-08-06 LAB — CBC
HCT: 30.4 % — ABNORMAL LOW (ref 36.0–46.0)
MCHC: 31.9 g/dL (ref 30.0–36.0)
MCV: 95.3 fL (ref 78.0–100.0)
RDW: 13.2 % (ref 11.5–15.5)

## 2011-08-06 MED ORDER — OXYCODONE-ACETAMINOPHEN 5-325 MG/5ML PO SOLN
5.0000 mL | ORAL | Status: DC | PRN
  Administered 2011-08-06 – 2011-08-07 (×3): 5 mL via ORAL
  Filled 2011-08-06 (×3): qty 5

## 2011-08-06 NOTE — Progress Notes (Signed)
Visited with Mrs. Ferdinand who was seen by Dr Arby Barrette last night for complaint of back pain.  She is resting comfortably in bed breastfeeding her baby.  She reports that last night she had some right sided back pain and a feeling of "wobbliness" in her spine when walking.  She reports the pain is better today.  She notes that she had back pain throughout her pregnancy, predominantly on the left, which she felt was due to her scoliosis.  Since delivery she notes pain on the right side of her back, which does not extend to her extremities.  She has been ambulating to the restroom and is having no motor or sensory deficits in her lower extremities.  She is having no problems with bowel or bladder control.  She says the pain is getting better. Symptoms are not currently consistent with epidural hematoma or abscess.  These symptoms may be more likely due to muscle strain from labor and pushing.  Please call if symptoms change.  Thank you.  Jasmine December, MD

## 2011-08-06 NOTE — Progress Notes (Signed)
Back pain progressing despite medication; back feels "wobbly" when walking; Dr. Arby Barrette notified; advised patient not to walk without staff assistance

## 2011-08-06 NOTE — Progress Notes (Addendum)
Post Partum Day 1 Subjective: no complaints, up ad lib, voiding, tolerating PO and + flatus  Objective: Blood pressure 95/52, pulse 61, temperature 98.3 F (36.8 C), temperature source Oral, resp. rate 18, height 5' (1.524 m), weight 81.761 kg (180 lb 4 oz), last menstrual period 10/15/2010, SpO2 98.00%, unknown if currently breastfeeding.  Physical Exam:  General: alert and cooperative Lochia: appropriate Uterine Fundus: firm Incision: NA DVT Evaluation: No evidence of DVT seen on physical exam.   Basename 08/06/11 0530 08/05/11 0151  HGB 9.7* 11.6*  HCT 30.4* 35.7*    Assessment/Plan: Breastfeeding and Contraception Nexplanon May want to go home this afternoon. Anemia.   LOS: 2 days   Kyrra Prada V 08/06/2011, 8:59 AM

## 2011-08-06 NOTE — Progress Notes (Signed)
S: comfortable, little bleeding, slept little, breast     Feeding well O VSS     abd soft, nt, ff      sm  flowperineum clean intact     -Homans sign bilaterally,       No edema BP 95/52  Pulse 61  Temp(Src) 98.3 F (36.8 C) (Oral)  Resp 18  Ht 5' (1.524 m)  Wt 180 lb 4 oz (81.761 kg)  BMI 35.20 kg/m2  SpO2 98%  LMP 10/15/2010  Breastfeeding? Unknown Hemoglobin & Hematocrit     Component Value Date/Time   HGB 9.7* 08/06/2011 0530   HCT 30.4* 08/06/2011 0530    A normal involution     anemia     Lactating     Pp day  P continue care, plans nexplanon Lavera Guise, CNM

## 2011-08-07 MED ORDER — IBUPROFEN 100 MG/5ML PO SUSP: 600.0000 mg | Freq: Four times a day (QID) | ORAL | Status: DC | PRN

## 2011-08-07 NOTE — Discharge Summary (Signed)
Obstetric Discharge Summary Reason for Admission: onset of labor and rupture of membranes Prenatal Procedures: NST and ultrasound Intrapartum Procedures: spontaneous vaginal delivery Postpartum Procedures: none Complications-Operative and Postpartum: none Hemoglobin  Date Value Range Status  08/06/2011 9.7* 12.0-15.0 (g/dL) Final     HCT  Date Value Range Status  08/06/2011 30.4* 36.0-46.0 (%) Final   Hospital Course: Admitted with SROM and early labor on 08/04/11.  Negative GBS. Progressed spontaneously, with epidural placed, then rapid active labor/transitional stage. Delivery was performed by Sanda Klein, CNM, without complication. Patient and baby tolerated the procedure without difficulty, with no laceration noted. Infant to FTN. Mother and infant then had an uncomplicated postpartum course, with breast and bottle feeding going well.  Patient had a small amount engorgement noted on the day of discharge.  Mom's physical exam overall was WNL, and she was discharged home in stable condition. Contraception plan was Nexplanon.  She received adequate benefit from po pain medications, and was sent home with Ibuprophen.      Physical Exam:  General: alert Lochia: appropriate Uterine Fundus: firm Incision: Intact perineum DVT Evaluation: No evidence of DVT seen on physical exam. Negative Homan's sign.  Discharge Diagnoses: Term Pregnancy-delivered  Discharge Information: Date: 08/07/2011 Activity: Per [facility] handout Diet: routine Medications: Ibuprofen Condition: stable Instructions: refer to practice specific booklet Discharge to: home Contraception:  Nexplanon Follow-up Information    Follow up with [facility] in 6 weeks. (As needed)          Newborn Data: Live born female  Birth Weight: 7 lb 4.6 oz (3306 g) APGAR: 8, 9  Home with mother.  Nigel Bridgeman, CNM, [place] 08/07/2011, 8:51 AM

## 2011-08-07 NOTE — Discharge Instructions (Signed)

## 2011-08-09 ENCOUNTER — Encounter: Payer: Medicaid Other | Admitting: Obstetrics and Gynecology

## 2011-09-10 ENCOUNTER — Telehealth: Payer: Self-pay | Admitting: Obstetrics and Gynecology

## 2011-09-10 NOTE — Telephone Encounter (Signed)
Triage/epic 

## 2011-09-10 NOTE — Telephone Encounter (Signed)
PT CALLED, IS 5 WKS PP, STATES HER BLDG HAD STOPPED AND THEN STARTED AGAIN, TENDS TO FILL A PANTILINER BUT IS NOT WEARING ANYTHING THICKER THAN A PANTILINER.  PT ADVISED TO PUT ON AT LEAST A PAD THE THICKNESS OF A REGULAR SIZED PAD OR A SUPER PAD, IF SHE HAPPENS TO CHANGE A PAD WITH THAT THICKNESS EVERY HOUR, SHOULD CALL BACK.  PT OFFERED A SOONER APPT, BUT STATES HAS AN APPT ON Friday AND WILL JUST KEEP THAT APPT.  PT DENIES FEVER, BUT ADVISED TO TAKE TEMP WHEN SHE COULD AND IF >100.4 TO CALL OFFICE, PT VOICES UNDERSTANDING.

## 2011-09-13 ENCOUNTER — Encounter: Payer: Self-pay | Admitting: Obstetrics and Gynecology

## 2011-09-13 ENCOUNTER — Ambulatory Visit (INDEPENDENT_AMBULATORY_CARE_PROVIDER_SITE_OTHER): Payer: Medicaid Other | Admitting: Obstetrics and Gynecology

## 2011-09-13 DIAGNOSIS — IMO0002 Reserved for concepts with insufficient information to code with codable children: Secondary | ICD-10-CM

## 2011-09-13 DIAGNOSIS — O99345 Other mental disorders complicating the puerperium: Secondary | ICD-10-CM

## 2011-09-13 DIAGNOSIS — Z30430 Encounter for insertion of intrauterine contraceptive device: Secondary | ICD-10-CM

## 2011-09-13 DIAGNOSIS — F53 Postpartum depression: Secondary | ICD-10-CM | POA: Insufficient documentation

## 2011-09-13 NOTE — Progress Notes (Signed)
Date of delivery: 08/05/2011 Female Name: [facility] Vaginal delivery:yes Cesarean section:no Tubal ligation:no GDM:no Breast Feeding:yes Bottle Feeding:no Post-Partum Blues:yes Abnormal pap:no Normal GU function: yes Normal GI function:no Returning to work:no EPDS: 22  *Desires Nexplanon per approval by SL

## 2011-09-13 NOTE — Progress Notes (Signed)
Patient ID: Lorraine Beard, female   DOB: [birth year], 21 y.o.   MRN: [id number] Subjective:     Lorraine Beard is a 21 y.o. female who presents for a postpartum visit.  She has no c/o except feeling very sad and overwhelmed at times. EPDS score =22, has family support (Mother-in-law) at visit today.  Denies SI/HI.  She desires nexplanon for contraception - rv'd R/B/S and rec no IC until placed She is solely breastfeeding and that is going well. She declines meds for depression, is worried how they will effect BF, despite my reassurance and safety discussion.  She did agree to counseling referral and agreed to f/u w me on 6-27, or call/go to hospital sooner if SI/HI develop.   I have fully reviewed the prenatal and intrapartum course.    Patient is sexually active.   The following portions of the patient's history were reviewed and updated as appropriate: allergies, current medications, past family history, past medical history, past social history, past surgical history and problem list.  Review of Systems Pertinent items are noted in HPI.   Objective:    BP 114/68  Temp 98.2 F (36.8 C)  Resp 16  Ht 5' 1.5" (1.562 m)  Wt 171 lb (77.565 kg)  BMI 31.79 kg/m2  Breastfeeding? Yes  General:  alert, cooperative and no distress, tearful      Lungs: clear to auscultation bilaterally  Heart:  regular rate and rhythm, S1, S2 normal, no murmur  Abdomen: soft, non-tender; bowel sounds normal; no masses,  no organomegaly   Vulva:  normal  Vagina: normal vagina  Cervix:  normal  Uterus: normal size, contour, position, consistency, mobility, non-tender  Adnexa:  normal adnexa             Assessment:     Normal postpartum exam, except for mod PPD.  Pap smear not done at today's visit.  Due Nov 2014 Breastfeeding Constipation   Plan:  1. Has f/u 1wk w EP for nexplanon, no IC until than, f/u w me on 6-27.  2. Referral [facility] - secondary PPD, enc asking friend/family  for help, work on sleep, journal, etc.  3. Increase daily fiber 4. GC/CT done today  Kyrie Fludd M CNM 09/13/2011 6:45 PM

## 2011-09-13 NOTE — Patient Instructions (Addendum)

## 2011-09-14 LAB — GC/CHLAMYDIA PROBE AMP, GENITAL: GC Probe Amp, Genital: NEGATIVE

## 2011-09-16 ENCOUNTER — Telehealth: Payer: Self-pay | Admitting: Obstetrics and Gynecology

## 2011-09-16 ENCOUNTER — Telehealth: Payer: Self-pay

## 2011-09-16 NOTE — Telephone Encounter (Signed)
TC to pt.  States is feeling a little better.   Is taking time out for herself when she needs a break. Denies any suicidal or homicidal feelings.   Just spoke with someone from out office concerning constipation issue.   Lorraine Beard will F/U with that concern and call her back.  Pt agreeable and appreciative of concern.

## 2011-09-16 NOTE — Telephone Encounter (Signed)
Pt states she has been constipated and recently had bloody stool. Pt states she has tried eating foods high in fiber to help. Advised pt to try a stool softener along with the foods high in fiber, drink plenty of water, and follow up with her PCP. Pt does not have PCP but states she will go to a local urgent care where she has previously received care.

## 2011-09-16 NOTE — Telephone Encounter (Signed)
Message copied by Mason Jim on Mon Sep 16, 2011 10:04 AM ------      Message from: Malissa Hippo.      Created: Fri Sep 13, 2011  6:36 PM      Regarding: f/u PPD       Saw pt Fri 6-14, EPDS score 22, stable but severe PPD.      She has appt for nexplanon w EP on 6-21, but will you please call pt to see how she's doing?       THanks      SL

## 2011-09-16 NOTE — Telephone Encounter (Signed)
Triage/epic 

## 2011-09-20 ENCOUNTER — Encounter: Payer: Self-pay | Admitting: Obstetrics and Gynecology

## 2011-09-20 ENCOUNTER — Ambulatory Visit (INDEPENDENT_AMBULATORY_CARE_PROVIDER_SITE_OTHER): Payer: Medicaid Other | Admitting: Obstetrics and Gynecology

## 2011-09-20 VITALS — BP 100/60 | HR 62 | Wt 159.0 lb

## 2011-09-20 DIAGNOSIS — F99 Mental disorder, not otherwise specified: Secondary | ICD-10-CM

## 2011-09-20 DIAGNOSIS — Z30017 Encounter for initial prescription of implantable subdermal contraceptive: Secondary | ICD-10-CM

## 2011-09-20 DIAGNOSIS — F3289 Other specified depressive episodes: Secondary | ICD-10-CM

## 2011-09-20 DIAGNOSIS — IMO0001 Reserved for inherently not codable concepts without codable children: Secondary | ICD-10-CM

## 2011-09-20 DIAGNOSIS — Z309 Encounter for contraceptive management, unspecified: Secondary | ICD-10-CM

## 2011-09-20 DIAGNOSIS — F329 Major depressive disorder, single episode, unspecified: Secondary | ICD-10-CM

## 2011-09-20 DIAGNOSIS — F489 Nonpsychotic mental disorder, unspecified: Secondary | ICD-10-CM

## 2011-09-20 LAB — POCT URINE PREGNANCY: Preg Test, Ur: NEGATIVE

## 2011-09-20 MED ORDER — ETONOGESTREL 68 MG ~~LOC~~ IMPL
68.0000 mg | DRUG_IMPLANT | Freq: Once | SUBCUTANEOUS | Status: AC
  Administered 2011-09-20: 68 mg via SUBCUTANEOUS

## 2011-09-20 MED ORDER — ETONOGESTREL 68 MG ~~LOC~~ IMPL: 68.0000 mg | DRUG_IMPLANT | Freq: Once | SUBCUTANEOUS | Status: DC

## 2011-09-20 NOTE — Progress Notes (Addendum)
20YO S/P SVB 08/05/11 for Nexplanon insertion. Patient feels she has a good understanding of the Nexplanon (reviewed with CNM-S. Lillard), has no questions today.  Consent signed.  O: Nexplanon inserted in medial left upper arm per protocol without difficulty.  Dressed with sterile band-aid, 4 x 4 and Kling pressure dressing. Implant palpated by clinician & patient.  UPT-negative  A: Nexplanon Insertion (Lot # [id number])  P: Reviewed S/S of infection and wound care.      RTO-1 week   Kilah Drahos, PA-C

## 2011-09-20 NOTE — Addendum Note (Signed)
Addended byWinfred Leeds on: 09/20/2011 12:45 PM   Modules accepted: Orders

## 2011-09-20 NOTE — Patient Instructions (Addendum)
Keep follow up appointment in 1 week  Call [facility] [phone]:  -for temperature of 100.4 degrees Fahrenheit or more -pain not improved with over the counter pain medications (Ibuprofen, Advil, Aleve,     Tylenol or acetaminophen) -for excessive bleeding from insertion site -for excessive swelling redness or green drainage from your insertion site -for any other concerns  Use a back-up method of birth control for the next 4 weeks

## 2011-09-23 ENCOUNTER — Telehealth: Payer: Self-pay | Admitting: Obstetrics and Gynecology

## 2011-09-23 NOTE — Telephone Encounter (Signed)
TC TO PT REGARDING MSG BELOW, LM ON VM TO CALL BACK.

## 2011-09-23 NOTE — Telephone Encounter (Signed)
Message copied by Delon Sacramento on Mon Sep 23, 2011  9:33 AM ------      Message from: Malissa Hippo      Created: Sun Sep 22, 2011 11:02 PM      Regarding: f/u PPD/nexplanon       I don't see that pt has a f/u sched.      She can be on my schedule 09-26-11, double book is fine      She had severe PPD, pls review sx's w pt

## 2011-10-09 ENCOUNTER — Telehealth: Payer: Self-pay | Admitting: Obstetrics and Gynecology

## 2011-10-09 NOTE — Telephone Encounter (Signed)
I called pt to see how she was feeling regarding PPD and check up after having nexplanon placed. She said she was doing "much better" she was getting out more to see friends and has taken up knitting to keep herself occupied. She denied any SI/HI, She did have some bleeding after nexplanon placed, but that had resolved. Enc pt to f/u PRN or when time for annual exam.

## 2011-10-10 ENCOUNTER — Other Ambulatory Visit: Payer: Self-pay | Admitting: Obstetrics and Gynecology

## 2011-10-16 ENCOUNTER — Encounter: Payer: Medicaid Other | Admitting: Obstetrics and Gynecology

## 2012-06-08 ENCOUNTER — Telehealth: Payer: Self-pay | Admitting: Obstetrics and Gynecology

## 2012-06-08 NOTE — Telephone Encounter (Signed)
Lm on vm for pt to call back.

## 2012-07-06 ENCOUNTER — Encounter (HOSPITAL_COMMUNITY): Payer: Self-pay | Admitting: *Deleted

## 2012-07-06 ENCOUNTER — Emergency Department (HOSPITAL_COMMUNITY)
Admission: EM | Admit: 2012-07-06 | Discharge: 2012-07-06 | Disposition: A | Discharge status: Home / Self Care | Payer: Self-pay | Attending: Emergency Medicine | Admitting: Emergency Medicine

## 2012-07-06 DIAGNOSIS — Y9389 Activity, other specified: Secondary | ICD-10-CM | POA: Insufficient documentation

## 2012-07-06 DIAGNOSIS — Y929 Unspecified place or not applicable: Secondary | ICD-10-CM | POA: Insufficient documentation

## 2012-07-06 DIAGNOSIS — Z043 Encounter for examination and observation following other accident: Secondary | ICD-10-CM | POA: Insufficient documentation

## 2012-07-06 DIAGNOSIS — W010XXA Fall on same level from slipping, tripping and stumbling without subsequent striking against object, initial encounter: Secondary | ICD-10-CM | POA: Insufficient documentation

## 2012-07-06 DIAGNOSIS — Z8659 Personal history of other mental and behavioral disorders: Secondary | ICD-10-CM | POA: Insufficient documentation

## 2012-07-06 DIAGNOSIS — Z8739 Personal history of other diseases of the musculoskeletal system and connective tissue: Secondary | ICD-10-CM | POA: Insufficient documentation

## 2012-07-06 HISTORY — DX: Scoliosis, unspecified: M41.9

## 2012-07-06 LAB — POCT I-STAT, CHEM 8
BUN: 19 mg/dL (ref 6–23)
Calcium, Ion: 1.07 mmol/L — ABNORMAL LOW (ref 1.12–1.23)
Glucose, Bld: 98 mg/dL (ref 70–99)
HCT: 43 % (ref 36.0–46.0)
TCO2: 25 mmol/L (ref 0–100)

## 2012-07-06 MED ORDER — IBUPROFEN 600 MG PO TABS: 600.0000 mg | ORAL_TABLET | Freq: Four times a day (QID) | ORAL | Status: DC | PRN

## 2012-07-06 MED ORDER — OXYCODONE-ACETAMINOPHEN 5-325 MG PO TABS
1.0000 | ORAL_TABLET | Freq: Once | ORAL | Status: DC
  Filled 2012-07-06: qty 1

## 2012-07-06 NOTE — ED Notes (Signed)
Pt came to front desk and stated she thinks the needle broke in her arm. This RN palpated area, absolutely no swelling, no redness to site. This RN concludes that there is nothing wrong with draw site.

## 2012-07-06 NOTE — ED Notes (Signed)
Pt states she was at [facility] and almost fell forward but caught herself.  "I saw everything go black",  No injury, No LOC, states she still feels dizzy and has a HA.

## 2012-07-06 NOTE — ED Notes (Signed)
Rx x 0.  Pt voiced understanding to f/u with PCP and return for worsening condition.  

## 2012-07-06 NOTE — ED Provider Notes (Addendum)
History     CSN: [id number]  Arrival date & time 07/06/12  4098   First MD Initiated Contact with Patient 07/06/12 2306      Chief Complaint  Patient presents with  . Fall    (Consider location/radiation/quality/duration/timing/severity/associated sxs/prior treatment) HPI Hx per PT.  Slipped around 6pm on apple sauce at [employer].  She was able to catch herself.  No LOC. No injury. PT currently has no complaints, wanted to be checked out. No head or neck injury. No CP or SOB, no weakness or numbness. LMP 2 years has 11 month child.  No dizzieness or near syncope.   Past Medical History  Diagnosis Date  . No pertinent past medical history   . Mental disorder   . Depression   . Scoliosis     Past Surgical History  Procedure Laterality Date  . No past surgeries      Family History  Problem Relation Age of Onset  . Anesthesia problems Neg Hx   . Hearing loss Neg Hx   . Hypertension Mother   . Diabetes Mother   . Kidney disease Mother   . Hypertension Father   . Alcohol abuse Father   . Alcohol abuse Maternal Grandmother   . Stroke Paternal Grandmother     History  Substance Use Topics  . Smoking status: Never Smoker   . Smokeless tobacco: Never Used  . Alcohol Use: 0.0 oz/week     Comment: occ    OB History   Grav Para Term Preterm Abortions TAB SAB Ect Mult Living   2 2 2       2       Review of Systems  Constitutional: Negative for fever and chills.  HENT: Negative for neck pain and neck stiffness.   Eyes: Negative for pain.  Respiratory: Negative for shortness of breath.   Cardiovascular: Negative for chest pain.  Gastrointestinal: Negative for abdominal pain.  Genitourinary: Negative for dysuria.  Musculoskeletal: Negative for back pain.  Skin: Negative for rash.  Neurological: Negative for headaches.  All other systems reviewed and are negative.    Allergies  Peanut-containing drug products and Food  Home Medications   Current Outpatient Rx   Name  Route  Sig  Dispense  Refill  . ibuprofen (ADVIL,MOTRIN) 100 MG/5ML suspension   Oral   Take 30 mLs (600 mg total) by mouth every 6 (six) hours as needed for fever.   240 mL   2   . ibuprofen (ADVIL,MOTRIN) 600 MG tablet   Oral   Take 1 tablet (600 mg total) by mouth every 6 (six) hours as needed for pain.   30 tablet   0   . Multiple Vitamin CHEW   Oral   Chew by mouth.           BP 120/73  Pulse 64  Temp(Src) 97.9 F (36.6 C) (Oral)  Resp 16  SpO2 100%  Physical Exam  Constitutional: She is oriented to person, place, and time. She appears well-developed and well-nourished.  HENT:  Head: Normocephalic and atraumatic.  Eyes: Conjunctivae and EOM are normal. Pupils are equal, round, and reactive to light.  Neck: Full passive range of motion without pain. Neck supple. No thyromegaly present.  No meningismus, no c spine tenderness  Cardiovascular: Normal rate, regular rhythm, S1 normal, S2 normal and intact distal pulses.   Pulmonary/Chest: Effort normal and breath sounds normal.  Abdominal: Soft. Bowel sounds are normal. There is no tenderness. There is no CVA  tenderness.  Musculoskeletal: Normal range of motion.  Neurological: She is alert and oriented to person, place, and time. She has normal strength and normal reflexes. No cranial nerve deficit or sensory deficit. She displays a negative Romberg sign. GCS eye subscore is 4. GCS verbal subscore is 5. GCS motor subscore is 6.  Normal Gait  Skin: Skin is warm and dry. No rash noted. No cyanosis. Nails show no clubbing.  Psychiatric: She has a normal mood and affect. Her speech is normal and behavior is normal.    ED Course  Procedures (including critical care time)  Results for orders placed during the hospital encounter of 07/06/12  POCT I-STAT, CHEM 8      Result Value Range   Sodium 141  135 - 145 mEq/L   Potassium 4.6  3.5 - 5.1 mEq/L   Chloride 110  96 - 112 mEq/L   BUN 19  6 - 23 mg/dL    Creatinine, Ser 1.61  0.50 - 1.10 mg/dL   Glucose, Bld 98  70 - 99 mg/dL   Calcium, Ion 0.96 (*) 1.12 - 1.23 mmol/L   TCO2 25  0 - 100 mmol/L   Hemoglobin 14.6  12.0 - 15.0 g/dL   HCT 04.5  40.9 - 81.1 %      1. Fall due to wet surface, initial encounter     Date: 07/06/2012  Rate: 59  Rhythm: normal sinus rhythm  QRS Axis: normal  Intervals: normal  ST/T Wave abnormalities: nonspecific ST changes  Conduction Disutrbances:none  Narrative Interpretation:   Old EKG Reviewed: none available     MDM  Slipped at [facility] with fall, no injry  Evaluated with labs and ECG  MSE as above. No pain or indication for admit or imaging at this time. Outpatient referral provided.          Sunnie Nielsen, MD 07/06/12 9147  Sunnie Nielsen, MD 07/08/12 [phone]

## 2012-07-14 ENCOUNTER — Emergency Department (INDEPENDENT_AMBULATORY_CARE_PROVIDER_SITE_OTHER): Payer: Self-pay

## 2012-07-14 ENCOUNTER — Encounter (HOSPITAL_COMMUNITY): Payer: Self-pay | Admitting: *Deleted

## 2012-07-14 ENCOUNTER — Emergency Department (INDEPENDENT_AMBULATORY_CARE_PROVIDER_SITE_OTHER)
Admission: EM | Admit: 2012-07-14 | Discharge: 2012-07-14 | Disposition: A | Discharge status: Home / Self Care | Payer: Self-pay | Source: Home / Self Care | Attending: Emergency Medicine | Admitting: Emergency Medicine

## 2012-07-14 DIAGNOSIS — W19XXXD Unspecified fall, subsequent encounter: Secondary | ICD-10-CM

## 2012-07-14 DIAGNOSIS — R51 Headache: Secondary | ICD-10-CM

## 2012-07-14 DIAGNOSIS — S20229A Contusion of unspecified back wall of thorax, initial encounter: Secondary | ICD-10-CM

## 2012-07-14 DIAGNOSIS — W19XXXA Unspecified fall, initial encounter: Secondary | ICD-10-CM

## 2012-07-14 MED ORDER — MELOXICAM 7.5 MG PO TABS: 7.5000 mg | ORAL_TABLET | Freq: Every day | ORAL | Status: DC

## 2012-07-14 NOTE — ED Provider Notes (Signed)
History     CSN: [id number]  Arrival date & time 07/14/12  1445   First MD Initiated Contact with Patient 07/14/12 1459      Chief Complaint  Patient presents with  . Fall    (Consider location/radiation/quality/duration/timing/severity/associated sxs/prior treatment) HPI Comments: Patient presents urgent care describing that she fell about 8 days ago at [facility] she slipped on applesauce. She describes she fell backwards sliding and hitting the back of her head and she is uncertain if she passed out or not. Since then she's been experiencing moderate to severe back pain. Patient points towards her neck area, thoracic region and lumbar regions. At that point she went to the emergency department for evaluation she describes that no x-rays or studies were done at that time. She has been taking Tylenol for her pain. Movement and activity makes her back pain much worse leaning forward, laying flat sitting for long periods of time exacerbates her pain. She's also been having generalized headaches. Patient denies any nausea vomiting visual changes or paresthesias.  Patient denies any numbness tingling sensation, urinary incontinence or fecal incontinence or weakness of her lower extremities. She decided to come in to be checked as she continues to have pain.  Patient is a 22 y.o. female presenting with fall. The history is provided by the patient.  Fall The accident occurred more than 1 week ago. The fall occurred while walking. She fell from a height of 1 to 2 ft. Point of impact: left side of upper back and neck.midback and lower back. The pain is present in the neck. The pain is at a severity of 8/10. The pain is moderate. She was ambulatory at the scene. Associated symptoms include headaches. Pertinent negatives include no visual change, no fever, no numbness, no abdominal pain, no nausea, no hearing loss and no tingling. The symptoms are aggravated by activity, standing, flexion, pressure on the  injury, extension and sitting. She has tried NSAIDs for the symptoms. The treatment provided no relief.    Past Medical History  Diagnosis Date  . No pertinent past medical history   . Mental disorder   . Depression   . Scoliosis     Past Surgical History  Procedure Laterality Date  . No past surgeries      Family History  Problem Relation Age of Onset  . Anesthesia problems Neg Hx   . Hearing loss Neg Hx   . Hypertension Mother   . Diabetes Mother   . Kidney disease Mother   . Hypertension Father   . Alcohol abuse Father   . Alcohol abuse Maternal Grandmother   . Stroke Paternal Grandmother     History  Substance Use Topics  . Smoking status: Never Smoker   . Smokeless tobacco: Never Used  . Alcohol Use: 0.0 oz/week     Comment: occ    OB History   Grav Para Term Preterm Abortions TAB SAB Ect Mult Living   2 2 2       2       Review of Systems  Constitutional: Positive for activity change. Negative for fever, chills, diaphoresis, appetite change, fatigue and unexpected weight change.  HENT: Positive for neck pain. Negative for neck stiffness.   Respiratory: Negative for cough and shortness of breath.   Gastrointestinal: Negative for nausea and abdominal pain.  Musculoskeletal: Positive for back pain. Negative for myalgias, joint swelling and gait problem.  Skin: Negative for color change, pallor, rash and wound.  Neurological: Positive for  headaches. Negative for dizziness, tingling, weakness and numbness.    Allergies  Peanut-containing drug products and Food  Home Medications   Current Outpatient Rx  Name  Route  Sig  Dispense  Refill  . meloxicam (MOBIC) 7.5 MG tablet   Oral   Take 1 tablet (7.5 mg total) by mouth daily. Take one tablet daily for 2 weeks   14 tablet   0   . Multiple Vitamin CHEW   Oral   Chew by mouth.           BP 112/68  Pulse 86  Temp(Src) 97.8 F (36.6 C) (Oral)  Resp 16  SpO2 100%  Physical Exam  Nursing note  and vitals reviewed. Constitutional: She is oriented to person, place, and time. She appears well-developed and well-nourished. No distress.  Neck: Normal range of motion. Spinous process tenderness and muscular tenderness present. No rigidity. No Kernig's sign noted.  Pulmonary/Chest: Effort normal and breath sounds normal. No respiratory distress.  Musculoskeletal: She exhibits tenderness.       Cervical back: She exhibits decreased range of motion, tenderness, bony tenderness and pain. She exhibits no swelling, no edema, no deformity, no laceration, no spasm and normal pulse.       Thoracic back: She exhibits decreased range of motion, tenderness, bony tenderness and pain. She exhibits no swelling, no edema, no deformity, no spasm and normal pulse.       Lumbar back: She exhibits decreased range of motion, tenderness, bony tenderness and pain. She exhibits no swelling, no edema, no deformity, no laceration, no spasm and normal pulse.       Back:  Neurological: She is alert and oriented to person, place, and time. She has normal strength. She displays no tremor. No cranial nerve deficit or sensory deficit. She exhibits normal muscle tone.  Skin: Skin is warm. No abrasion, no bruising, no burn, no ecchymosis, no laceration, no lesion, no petechiae and no rash noted. No erythema.       ED Course  Procedures (including critical care time)  Labs Reviewed - No data to display Dg Cervical Spine Complete  07/14/2012  *RADIOLOGY REPORT*  Clinical Data: Pain post trauma  CERVICAL SPINE - COMPLETE 4+ VIEW  Comparison: July 05, 2003  Findings: Frontal, lateral, open mouth odontoid, and bilateral oblique views were obtained.  Dye there is no fracture or spondylolisthesis.  Prevertebral soft tissues and predental space regions are normal.  Disc spaces appear intact.  There is no appreciable exit foraminal narrowing on the oblique views.  IMPRESSION: No fracture or spondylolisthesis.   Original Report  Authenticated By: Bretta Bang, M.D.    Dg Thoracic Spine 4v  07/14/2012  *RADIOLOGY REPORT*  Clinical Data: Persistent left lateral upper back pain after fall 1 week ago.  THORACIC SPINE - 4+ VIEW  Comparison: No prior thoracic spine imaging.  Left rib x-rays 07/27/2003 and two-view chest x-ray of the same date.  Findings: Anatomic posterior alignment.  No fractures.  Thoracic scoliosis convex right centered at T6-7 which measures approximately 15 degrees (maximum curvature measured from upper endplate of T4 through lower endplate of T11). Well-preserved disc spaces.  No significant spondylosis.  Pedicles intact.  IMPRESSION: No acute osseous abnormality.  Thoracic scoliosis convex right approximating 15 degrees.   Original Report Authenticated By: Hulan Saas, M.D.    Dg Lumbar Spine Complete  07/14/2012  *RADIOLOGY REPORT*  Clinical Data: 22 year old female status post fall.  Mid lumbar pain.  LUMBAR SPINE - COMPLETE 4+  VIEW  Comparison: Thoracic radiographs from earlier the same day reported separately.  Findings: Dextroconvex lumbar scoliosis, partially demonstrated on the comparison.  Normal lumbar segmentation. Bone mineralization is within normal limits.  No pars fracture.  No lumbar spondylolisthesis.  Relatively preserved disc spaces.  Sacral ala and SI joints within normal limits.  IMPRESSION: No acute osseous abnormality in the lumbar spine.  Thoracolumbar scoliosis.   Original Report Authenticated By: Erskine Speed, M.D.      1. Contusion of back, unspecified laterality, initial encounter   2. Fall, subsequent encounter   3. Generalized headaches       MDM  Status post fall more than a week ago. Patient with normal neurological exam. With several areas of reproducible tenderness on her muscle skeletal exam. X-rays were negative for fractures or dislocations. Have encouraged patient to take a course of meloxicam and to followup with her primary care Dr. Have also discussed with  patient about further symptoms that should warrant to return to the emergency department.      Jimmie Molly, MD 07/14/12 [phone]

## 2012-07-14 NOTE — ED Notes (Signed)
Pt  statdes  She  Felled  About  8  Days  Ago   States  She  Was  Seen  Ed  But  Not  X  Rayed States  She  Slipped  On  Liquid on fllor  Causing  Fall  asays  She  Blacked  Out  Then    Now  States  Pain l  Side  When  Breathes  As  Well  As    H/a  And  Back pain She  Ambulated  To  Room with a  Steady  Fluid  Gait Sitting  Upright on  Exam table  In no  distress   \

## 2014-01-31 ENCOUNTER — Encounter (HOSPITAL_COMMUNITY): Payer: Self-pay | Admitting: *Deleted

## 2014-11-13 ENCOUNTER — Emergency Department (HOSPITAL_COMMUNITY)
Admission: EM | Admit: 2014-11-13 | Discharge: 2014-11-13 | Disposition: A | Discharge status: Home / Self Care | Payer: No Typology Code available for payment source | Attending: Emergency Medicine | Admitting: Emergency Medicine

## 2014-11-13 ENCOUNTER — Encounter (HOSPITAL_COMMUNITY): Payer: Self-pay | Admitting: *Deleted

## 2014-11-13 DIAGNOSIS — M419 Scoliosis, unspecified: Secondary | ICD-10-CM | POA: Insufficient documentation

## 2014-11-13 DIAGNOSIS — Z0000 Encounter for general adult medical examination without abnormal findings: Secondary | ICD-10-CM | POA: Diagnosis present

## 2014-11-13 DIAGNOSIS — Z8659 Personal history of other mental and behavioral disorders: Secondary | ICD-10-CM | POA: Diagnosis not present

## 2014-11-13 DIAGNOSIS — Z791 Long term (current) use of non-steroidal anti-inflammatories (NSAID): Secondary | ICD-10-CM | POA: Insufficient documentation

## 2014-11-13 DIAGNOSIS — Z139 Encounter for screening, unspecified: Secondary | ICD-10-CM

## 2014-11-13 LAB — POC URINE PREG, ED: Preg Test, Ur: NEGATIVE

## 2014-11-13 MED ORDER — ULIPRISTAL ACETATE 30 MG PO TABS
ORAL_TABLET | ORAL | Status: AC
  Administered 2014-11-13: 30 mg
  Filled 2014-11-13: qty 1

## 2014-11-13 MED ORDER — AZITHROMYCIN 1 G PO PACK
PACK | ORAL | Status: AC
  Administered 2014-11-13: 1 g
  Filled 2014-11-13: qty 1

## 2014-11-13 MED ORDER — METRONIDAZOLE 500 MG PO TABS
ORAL_TABLET | ORAL | Status: AC
  Administered 2014-11-13: 2000 mg
  Filled 2014-11-13: qty 4

## 2014-11-13 MED ORDER — CEFIXIME 400 MG PO CAPS
ORAL_CAPSULE | ORAL | Status: AC
  Administered 2014-11-13: 400 mg
  Filled 2014-11-13: qty 1

## 2014-11-13 MED ORDER — PROMETHAZINE HCL 25 MG PO TABS
ORAL_TABLET | ORAL | Status: AC
  Filled 2014-11-13: qty 3
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
## 2014-11-13 NOTE — ED Notes (Signed)
Sane nurse contact for assessment of PT
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
## 2014-11-13 NOTE — ED Notes (Signed)
Pt tearful at triage. Denies SI/HI.
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
## 2018-10-27 ENCOUNTER — Inpatient Hospital Stay (HOSPITAL_COMMUNITY)
Admission: AD | Admit: 2018-10-27 | Discharge: 2018-10-27 | Disposition: A | Discharge status: Home / Self Care | Payer: Self-pay | Attending: Family Medicine | Admitting: Family Medicine

## 2018-10-27 ENCOUNTER — Other Ambulatory Visit: Payer: Self-pay

## 2018-10-27 DIAGNOSIS — Z3202 Encounter for pregnancy test, result negative: Secondary | ICD-10-CM

## 2018-10-27 DIAGNOSIS — R109 Unspecified abdominal pain: Secondary | ICD-10-CM | POA: Insufficient documentation

## 2018-10-27 LAB — POCT PREGNANCY, URINE: Preg Test, Ur: NEGATIVE

## 2018-10-27 LAB — URINALYSIS, ROUTINE W REFLEX MICROSCOPIC
Bilirubin Urine: NEGATIVE
Glucose, UA: NEGATIVE mg/dL
Hgb urine dipstick: NEGATIVE
Ketones, ur: NEGATIVE mg/dL
Nitrite: NEGATIVE
Protein, ur: NEGATIVE mg/dL
Specific Gravity, Urine: 1.026 (ref 1.005–1.030)
pH: 6 (ref 5.0–8.0)

## 2018-10-27 NOTE — MAU Provider Note (Signed)
None     S Ms. Lorraine Beard is a 28 y.o. G62P2002 non-pregnant female who presents to MAU today with complaint of abdominal pain.   She was informed of her negative UPT today and stated that "the urine or blood test never show up until I am at least 6 months pregnant."  She was offered transfer to the ED for evaluation of her complaints and stated "that's okay I am just going to go to [facility]."  O BP 119/73 (BP Location: Right Arm)   Pulse 83   Temp 99.1 F (37.3 C) (Oral)   Resp 18   Ht 5' (1.524 m)   Wt 90.8 kg   LMP 10/11/2018   BMI 39.08 kg/m  Physical Exam  Constitutional: She is oriented to person, place, and time. She appears well-developed and well-nourished. No distress.  HENT:  Head: Normocephalic and atraumatic.  Eyes: Conjunctivae are normal.  Neck: Normal range of motion.  Cardiovascular: Normal rate.  Respiratory: Effort normal.  Musculoskeletal: Normal range of motion.  Neurological: She is alert and oriented to person, place, and time.  Psychiatric: She has a normal mood and affect. Her behavior is normal.    A Non pregnant female Medical screening exam complete   P -Discharge from MAU in stable condition -Patient given the option of transfer to [facility] for further evaluation or seek care in outpatient facility of choice -Patient may return to MAU as needed for pregnancy related complaints  Gavin Pound, CNM 10/27/2018 10:35 PM
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
## 2019-11-18 ENCOUNTER — Other Ambulatory Visit: Payer: Self-pay

## 2019-11-18 ENCOUNTER — Emergency Department (HOSPITAL_COMMUNITY): Payer: Self-pay

## 2019-11-18 ENCOUNTER — Encounter (HOSPITAL_COMMUNITY): Payer: Self-pay | Admitting: Emergency Medicine

## 2019-11-18 ENCOUNTER — Emergency Department (HOSPITAL_COMMUNITY)
Admission: EM | Admit: 2019-11-18 | Discharge: 2019-11-19 | Disposition: A | Discharge status: Home / Self Care | Payer: Self-pay | Attending: Emergency Medicine | Admitting: Emergency Medicine

## 2019-11-18 DIAGNOSIS — R072 Precordial pain: Secondary | ICD-10-CM | POA: Insufficient documentation

## 2019-11-18 DIAGNOSIS — Z91010 Allergy to peanuts: Secondary | ICD-10-CM | POA: Insufficient documentation

## 2019-11-18 DIAGNOSIS — R0789 Other chest pain: Secondary | ICD-10-CM

## 2019-11-18 LAB — CBC
HCT: 40.7 % (ref 36.0–46.0)
Hemoglobin: 13.7 g/dL (ref 12.0–15.0)
MCH: 30.2 pg (ref 26.0–34.0)
MCHC: 33.7 g/dL (ref 30.0–36.0)
MCV: 89.6 fL (ref 80.0–100.0)
Platelets: 421 10*3/uL — ABNORMAL HIGH (ref 150–400)
RBC: 4.54 MIL/uL (ref 3.87–5.11)
RDW: 12.8 % (ref 11.5–15.5)
WBC: 12.1 10*3/uL — ABNORMAL HIGH (ref 4.0–10.5)
nRBC: 0 % (ref 0.0–0.2)

## 2019-11-18 LAB — I-STAT BETA HCG BLOOD, ED (MC, WL, AP ONLY): I-stat hCG, quantitative: <5 m[IU]/mL (ref <5)

## 2019-11-18 LAB — BASIC METABOLIC PANEL
Anion gap: 11 (ref 5–15)
BUN: 10 mg/dL (ref 6–20)
CO2: 24 mmol/L (ref 22–32)
Calcium: 9.4 mg/dL (ref 8.9–10.3)
Chloride: 102 mmol/L (ref 98–111)
Creatinine, Ser: 0.85 mg/dL (ref 0.44–1.00)
GFR calc Af Amer: >60 mL/min (ref >60)
GFR calc non Af Amer: >60 mL/min (ref >60)
Glucose, Bld: 97 mg/dL (ref 70–99)
Potassium: 4 mmol/L (ref 3.5–5.1)
Sodium: 137 mmol/L (ref 135–145)

## 2019-11-18 LAB — TROPONIN I (HIGH SENSITIVITY): Troponin I (High Sensitivity): <2 ng/L (ref <18)

## 2019-11-18 MED ORDER — KETOROLAC TROMETHAMINE 30 MG/ML IJ SOLN
15.0000 mg | Freq: Once | INTRAMUSCULAR | Status: AC
  Administered 2019-11-18: 15 mg via INTRAVENOUS
  Filled 2019-11-18: qty 1

## 2019-11-18 MED ORDER — METHYLPREDNISOLONE SODIUM SUCC 125 MG IJ SOLR
125.0000 mg | Freq: Once | INTRAMUSCULAR | Status: AC
  Administered 2019-11-18: 125 mg via INTRAVENOUS
  Filled 2019-11-18: qty 2

## 2019-11-18 MED ORDER — OXYCODONE-ACETAMINOPHEN 5-325 MG PO TABS
2.0000 | ORAL_TABLET | Freq: Once | ORAL | Status: AC
  Administered 2019-11-18: 2 via ORAL
  Filled 2019-11-18: qty 2

## 2019-11-18 MED ORDER — PREDNISONE 20 MG PO TABS: 60.0000 mg | ORAL_TABLET | Freq: Once | ORAL | Status: DC

## 2019-11-18 MED ORDER — KETOROLAC TROMETHAMINE 60 MG/2ML IM SOLN: 60.0000 mg | Freq: Once | INTRAMUSCULAR | Status: DC

## 2019-11-18 NOTE — ED Provider Notes (Signed)
[facility]-EMERGENCY DEPT Provider Note   CSN: [id number] Arrival date & time: 11/18/19  1627     History Chief Complaint  Patient presents with  . Chest Pain    Lorraine Beard is a 29 y.o. female.   Chest Pain Pain location:  Substernal area and L chest Pain quality: sharp   Pain radiates to:  Does not radiate Pain severity:  Mild Timing:  Constant Progression:  Worsening Chronicity:  New Context: breathing and raising an arm   Relieved by: movement. Worsened by:  Certain positions Associated symptoms: no abdominal pain, no numbness and no shortness of breath (just with breathing)        Past Medical History:  Diagnosis Date  . Depression   . Mental disorder   . No pertinent past medical history   . Scoliosis     Patient Active Problem List   Diagnosis Date Noted  . Mental disorder   . Depression   . Postpartum depression 09/13/2011  . NSVD (normal spontaneous vaginal delivery) 08/05/2011  . H/O postpartum depression, currently pregnant 07/03/2011  . H/O scoliosis 07/03/2011    Past Surgical History:  Procedure Laterality Date  . NO PAST SURGERIES       OB History    Gravida  2   Para  2   Term  2   Preterm      AB      Living  2     SAB      TAB      Ectopic      Multiple      Live Births  2           Family History  Problem Relation Age of Onset  . Hypertension Mother   . Diabetes Mother   . Kidney disease Mother   . Hypertension Father   . Alcohol abuse Father   . Stroke Paternal Grandmother   . Alcohol abuse Maternal Grandmother   . Anesthesia problems Neg Hx   . Hearing loss Neg Hx     Social History   Tobacco Use  . Smoking status: Never Smoker  . Smokeless tobacco: Never Used  Substance Use Topics  . Alcohol use: Yes    Comment: occ  . Drug use: No    Home Medications Prior to Admission medications   Medication Sig Start Date End Date Taking? Authorizing Provider  meloxicam  (MOBIC) 7.5 MG tablet Take 1 tablet (7.5 mg total) by mouth daily as needed for pain. 11/19/19   Shequilla Goodgame, Barbara Cower, MD  Multiple Vitamin CHEW Chew by mouth.    [provider]  predniSONE (DELTASONE) 20 MG tablet 3 tabs po daily x 3 days, then 2 tabs x 3 days, then 1.5 tabs x 3 days, then 1 tab x 3 days, then 0.5 tabs x 3 days 11/19/19   Alyna Stensland, Barbara Cower, MD    Allergies    Peanut-containing drug products and Food  Review of Systems   Review of Systems  Respiratory: Negative for shortness of breath (just with breathing).   Cardiovascular: Positive for chest pain.  Gastrointestinal: Negative for abdominal pain.  Neurological: Negative for numbness.  All other systems reviewed and are negative.   Physical Exam Updated Vital Signs BP 122/84 (BP Location: Left Arm)   Pulse 68   Temp 97.9 F (36.6 C) (Oral)   Resp 18   LMP 11/18/2019   SpO2 99%   Physical Exam Vitals and nursing note reviewed.  Constitutional:  Appearance: She is well-developed.  HENT:     Head: Normocephalic and atraumatic.  Cardiovascular:     Rate and Rhythm: Normal rate and regular rhythm.  Pulmonary:     Effort: No respiratory distress.     Breath sounds: No stridor.  Chest:     Chest wall: No mass or tenderness.  Abdominal:     General: There is no distension.     Palpations: Abdomen is soft.  Musculoskeletal:        General: Normal range of motion.     Cervical back: Normal range of motion.     Right lower leg: No tenderness. No edema.     Left lower leg: No tenderness. No edema.  Skin:    General: Skin is warm and dry.  Neurological:     General: No focal deficit present.     Mental Status: She is alert.     ED Results / Procedures / Treatments   Labs (all labs ordered are listed, but only abnormal results are displayed) Labs Reviewed  CBC - Abnormal; Notable for the following components:      Result Value   WBC 12.1 (*)    Platelets 421 (*)    All other components within normal  limits  BASIC METABOLIC PANEL  I-STAT BETA HCG BLOOD, ED ([facility], [facility], [facility] ONLY)  TROPONIN I (HIGH SENSITIVITY)    EKG EKG Interpretation  Date/Time:  Thursday November 18 2019 16:40:26 EDT Ventricular Rate:  67 PR Interval:    QRS Duration: 79 QT Interval:  375 QTC Calculation: 396 R Axis:   30 Text Interpretation: Sinus rhythm Borderline short PR interval Probable left atrial enlargement 12 Lead; Mason-Likar No significant change since last tracing Confirmed by Alvira Monday ([id number]) on 11/18/2019 4:48:23 PM   Radiology DG Chest 2 View  Result Date: 11/18/2019 CLINICAL DATA:  Chest pain EXAM: CHEST - 2 VIEW COMPARISON:  None. FINDINGS: The heart size and mediastinal contours are within normal limits. Both lungs are clear. Scoliosis of the spine. IMPRESSION: No active cardiopulmonary disease. Electronically Signed   By: Jasmine Pang M.D.   On: 11/18/2019 17:08    Procedures Procedures (including critical care time)  Medications Ordered in ED Medications  methylPREDNISolone sodium succinate (SOLU-MEDROL) 125 mg/2 mL injection 125 mg (125 mg Intravenous Given 11/18/19 2337)  oxyCODONE-acetaminophen (PERCOCET/ROXICET) 5-325 MG per tablet 2 tablet (2 tablets Oral Given 11/18/19 2335)  ketorolac (TORADOL) 30 MG/ML injection 15 mg (15 mg Intravenous Given 11/18/19 2336)    ED Course  I have reviewed the triage vital signs and the nursing notes.  Pertinent labs & imaging results that were available during my care of the patient were reviewed by me and considered in my medical decision making (see chart for details).    MDM Rules/Calculators/A&P                         Pleuritic chest pain. Unclear etiology will treat symptomatically. Low wells, negative perc, doubt PE. No e/o infarction or other cardiac cause. No rash to suggest zoster or other dermatologic condition. Pending BMP, but likely dc on steroid taper/nsaid  Bmp ok. Plan as above. Pain improved prior to discharge.   Final  Clinical Impression(s) / ED Diagnoses Final diagnoses:  Atypical chest pain    Rx / DC Orders ED Discharge Orders         Ordered    predniSONE (DELTASONE) 20 MG tablet  11/19/19 0026    meloxicam (MOBIC) 7.5 MG tablet  Daily PRN        11/19/19 0026           Hao Dion, Barbara Cower, MD 11/19/19 [phone]

## 2019-11-18 NOTE — ED Triage Notes (Addendum)
Per EMS, patient from home, c/o sharp left chest pain radiating to left shoulder and arm since 0700. States patient worsens with inspiration and movement and left arm. Denies injury.  22g R AC

## 2019-11-19 MED ORDER — MELOXICAM 7.5 MG PO TABS: 7.5000 mg | ORAL_TABLET | Freq: Every day | ORAL | 0 refills | Status: AC | PRN

## 2019-11-19 MED ORDER — PREDNISONE 20 MG PO TABS: ORAL_TABLET | ORAL | 0 refills | Status: AC

## 2021-08-16 ENCOUNTER — Emergency Department (HOSPITAL_COMMUNITY): Payer: Self-pay

## 2021-08-16 ENCOUNTER — Emergency Department (HOSPITAL_COMMUNITY)
Admission: EM | Admit: 2021-08-16 | Discharge: 2021-08-16 | Disposition: A | Discharge status: Home / Self Care | Payer: Self-pay | Attending: Emergency Medicine | Admitting: Emergency Medicine

## 2021-08-16 ENCOUNTER — Encounter (HOSPITAL_COMMUNITY): Payer: Self-pay | Admitting: Oncology

## 2021-08-16 ENCOUNTER — Other Ambulatory Visit: Payer: Self-pay

## 2021-08-16 DIAGNOSIS — R1084 Generalized abdominal pain: Secondary | ICD-10-CM | POA: Insufficient documentation

## 2021-08-16 DIAGNOSIS — R079 Chest pain, unspecified: Secondary | ICD-10-CM | POA: Diagnosis not present

## 2021-08-16 DIAGNOSIS — N9489 Other specified conditions associated with female genital organs and menstrual cycle: Secondary | ICD-10-CM | POA: Insufficient documentation

## 2021-08-16 DIAGNOSIS — R519 Headache, unspecified: Secondary | ICD-10-CM | POA: Diagnosis present

## 2021-08-16 DIAGNOSIS — M25571 Pain in right ankle and joints of right foot: Secondary | ICD-10-CM | POA: Diagnosis not present

## 2021-08-16 DIAGNOSIS — Y92410 Unspecified street and highway as the place of occurrence of the external cause: Secondary | ICD-10-CM | POA: Diagnosis not present

## 2021-08-16 LAB — CBC WITH DIFFERENTIAL/PLATELET
Abs Immature Granulocytes: 0.03 10*3/uL (ref 0.00–0.07)
Basophils Absolute: 0 10*3/uL (ref 0.0–0.1)
Basophils Relative: 0 %
Eosinophils Absolute: 0.3 10*3/uL (ref 0.0–0.5)
Eosinophils Relative: 3 %
HCT: 40.8 % (ref 36.0–46.0)
Hemoglobin: 13.8 g/dL (ref 12.0–15.0)
Immature Granulocytes: 0 %
Lymphocytes Relative: 44 %
Lymphs Abs: 4.5 10*3/uL — ABNORMAL HIGH (ref 0.7–4.0)
MCH: 30.9 pg (ref 26.0–34.0)
MCHC: 33.8 g/dL (ref 30.0–36.0)
MCV: 91.5 fL (ref 80.0–100.0)
Monocytes Absolute: 0.6 10*3/uL (ref 0.1–1.0)
Monocytes Relative: 6 %
Neutro Abs: 4.9 10*3/uL (ref 1.7–7.7)
Neutrophils Relative %: 47 %
Platelets: 421 10*3/uL — ABNORMAL HIGH (ref 150–400)
RBC: 4.46 MIL/uL (ref 3.87–5.11)
RDW: 12.5 % (ref 11.5–15.5)
WBC: 10.3 10*3/uL (ref 4.0–10.5)
nRBC: 0 % (ref 0.0–0.2)

## 2021-08-16 LAB — BASIC METABOLIC PANEL
Anion gap: 5 (ref 5–15)
BUN: 11 mg/dL (ref 6–20)
CO2: 24 mmol/L (ref 22–32)
Calcium: 8.7 mg/dL — ABNORMAL LOW (ref 8.9–10.3)
Chloride: 106 mmol/L (ref 98–111)
Creatinine, Ser: 0.8 mg/dL (ref 0.44–1.00)
GFR, Estimated: >60 mL/min (ref >60)
Glucose, Bld: 110 mg/dL — ABNORMAL HIGH (ref 70–99)
Potassium: 3.5 mmol/L (ref 3.5–5.1)
Sodium: 135 mmol/L (ref 135–145)

## 2021-08-16 LAB — I-STAT BETA HCG BLOOD, ED (MC, WL, AP ONLY): I-stat hCG, quantitative: <5 m[IU]/mL (ref <5)

## 2021-08-16 MED ORDER — METHOCARBAMOL 500 MG PO TABS: 500.0000 mg | ORAL_TABLET | Freq: Two times a day (BID) | ORAL | 0 refills | Status: AC

## 2021-08-16 MED ORDER — FENTANYL CITRATE PF 50 MCG/ML IJ SOSY
25.0000 ug | PREFILLED_SYRINGE | Freq: Once | INTRAMUSCULAR | Status: AC
  Administered 2021-08-16: 25 ug via INTRAVENOUS
  Filled 2021-08-16: qty 1

## 2021-08-16 MED ORDER — NAPROXEN 375 MG PO TABS: 375.0000 mg | ORAL_TABLET | Freq: Two times a day (BID) | ORAL | 0 refills | Status: AC

## 2021-08-16 MED ORDER — DIPHENHYDRAMINE HCL 50 MG/ML IJ SOLN
25.0000 mg | Freq: Once | INTRAMUSCULAR | Status: AC
  Administered 2021-08-16: 25 mg via INTRAVENOUS
  Filled 2021-08-16: qty 1

## 2021-08-16 MED ORDER — PROCHLORPERAZINE EDISYLATE 10 MG/2ML IJ SOLN
10.0000 mg | Freq: Once | INTRAMUSCULAR | Status: AC
  Administered 2021-08-16: 10 mg via INTRAVENOUS
  Filled 2021-08-16: qty 2

## 2021-08-16 MED ORDER — IOHEXOL 300 MG/ML  SOLN
100.0000 mL | Freq: Once | INTRAMUSCULAR | Status: AC | PRN
  Administered 2021-08-16: 100 mL via INTRAVENOUS
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
## 2021-08-16 NOTE — Discharge Instructions (Signed)
Your work-up today was reassuring.  Your imaging did not show any signs of fractures or bleeding.  You will be sore in the coming days.  I would like for you to take Robaxin which is a muscle relaxer and naproxen which is anti-inflammatory and pain reliever.  Please do not mix the Robaxin with alcohol or operate heavy machinery while taking this medication as it can make you drowsy.  Please return to the emergency department for any worsening symptoms you might have.

## 2023-05-19 ENCOUNTER — Telehealth: Payer: Self-pay

## 2023-05-19 NOTE — Telephone Encounter (Signed)
 Copied from CRM [id number]. Topic: General - Other >> May 16, 2023  5:16 PM Victorino Dike T wrote: Reason for CRM: asking if patient would be able to get a pap test done at this office or is she would have to go to an ob/gyn [phone]

## 2023-05-19 NOTE — Telephone Encounter (Signed)
 If patients calls back it is okay for her to be directed back to me patient is inquiring about new patient appointment . Can send call to [facility]

## 2023-07-24 ENCOUNTER — Ambulatory Visit: Payer: Self-pay

## 2023-07-24 NOTE — Telephone Encounter (Signed)
 Pt called, LVM to call back to speak with NT at [phone]  Copied from CRM [id number]. Topic: Referral - Question >> Jul 24, 2023 10:44 AM Lorraine Beard wrote: Reason for CRM: Patient is asking for an OBGYN referral, She's currently at the urgent care and they stating they don't have all equip that it maybe fibroids. Patient would like for someone to reach out to her, [phone]

## 2023-07-24 NOTE — Telephone Encounter (Signed)
 3rd attempt, Pt called, LVM to call back to speak with NT at [phone]   Copied from CRM [id number]. Topic: Referral - Question >> Jul 24, 2023 10:44 AM Lorraine Beard wrote: Reason for CRM: Patient is asking for an OBGYN referral, She's currently at the urgent care and they stating they don't have all equip that it maybe fibroids. Patient would like for someone to reach out to her, [phone]

## 2023-07-28 DIAGNOSIS — R102 Pelvic and perineal pain: Secondary | ICD-10-CM | POA: Diagnosis not present

## 2023-07-28 DIAGNOSIS — N644 Mastodynia: Secondary | ICD-10-CM | POA: Diagnosis not present

## 2023-08-11 ENCOUNTER — Emergency Department (HOSPITAL_BASED_OUTPATIENT_CLINIC_OR_DEPARTMENT_OTHER): Admission: EM | Admit: 2023-08-11 | Discharge: 2023-08-11 | Disposition: A | Discharge status: Home / Self Care

## 2023-08-11 ENCOUNTER — Encounter (HOSPITAL_BASED_OUTPATIENT_CLINIC_OR_DEPARTMENT_OTHER): Payer: Self-pay

## 2023-08-11 ENCOUNTER — Other Ambulatory Visit: Payer: Self-pay

## 2023-08-11 ENCOUNTER — Emergency Department (HOSPITAL_BASED_OUTPATIENT_CLINIC_OR_DEPARTMENT_OTHER)

## 2023-08-11 ENCOUNTER — Ambulatory Visit: Payer: Self-pay

## 2023-08-11 DIAGNOSIS — K625 Hemorrhage of anus and rectum: Secondary | ICD-10-CM | POA: Diagnosis not present

## 2023-08-11 DIAGNOSIS — Z91010 Allergy to peanuts: Secondary | ICD-10-CM | POA: Insufficient documentation

## 2023-08-11 DIAGNOSIS — D259 Leiomyoma of uterus, unspecified: Secondary | ICD-10-CM | POA: Diagnosis not present

## 2023-08-11 DIAGNOSIS — R928 Other abnormal and inconclusive findings on diagnostic imaging of breast: Secondary | ICD-10-CM | POA: Diagnosis not present

## 2023-08-11 DIAGNOSIS — R9230 Dense breasts, unspecified: Secondary | ICD-10-CM | POA: Diagnosis not present

## 2023-08-11 DIAGNOSIS — N3000 Acute cystitis without hematuria: Secondary | ICD-10-CM | POA: Insufficient documentation

## 2023-08-11 DIAGNOSIS — R102 Pelvic and perineal pain: Secondary | ICD-10-CM | POA: Diagnosis not present

## 2023-08-11 LAB — COMPREHENSIVE METABOLIC PANEL WITH GFR
ALT: 10 U/L (ref 0–44)
AST: 17 U/L (ref 15–41)
Albumin: 4.2 g/dL (ref 3.5–5.0)
Alkaline Phosphatase: 64 U/L (ref 38–126)
Anion gap: 10 (ref 5–15)
BUN: 12 mg/dL (ref 6–20)
CO2: 27 mmol/L (ref 22–32)
Calcium: 10 mg/dL (ref 8.9–10.3)
Chloride: 104 mmol/L (ref 98–111)
Creatinine, Ser: 0.99 mg/dL (ref 0.44–1.00)
GFR, Estimated: >60 mL/min (ref >60)
Glucose, Bld: 76 mg/dL (ref 70–99)
Potassium: 4.1 mmol/L (ref 3.5–5.1)
Sodium: 140 mmol/L (ref 135–145)
Total Bilirubin: 0.5 mg/dL (ref 0.0–1.2)
Total Protein: 7.6 g/dL (ref 6.5–8.1)

## 2023-08-11 LAB — URINALYSIS, ROUTINE W REFLEX MICROSCOPIC
Bilirubin Urine: NEGATIVE
Glucose, UA: NEGATIVE mg/dL
Hgb urine dipstick: NEGATIVE
Ketones, ur: NEGATIVE mg/dL
Nitrite: NEGATIVE
Protein, ur: NEGATIVE mg/dL
Specific Gravity, Urine: 1.025 (ref 1.005–1.030)
pH: 7 (ref 5.0–8.0)

## 2023-08-11 LAB — URINALYSIS, W/ REFLEX TO CULTURE (INFECTION SUSPECTED)
Bilirubin Urine: NEGATIVE
Glucose, UA: NEGATIVE mg/dL
Hgb urine dipstick: NEGATIVE
Ketones, ur: NEGATIVE mg/dL
Leukocytes,Ua: NEGATIVE
Nitrite: NEGATIVE
Protein, ur: NEGATIVE mg/dL
RBC / HPF: NONE SEEN RBC/hpf (ref 0–5)
Specific Gravity, Urine: 1.025 (ref 1.005–1.030)
WBC, UA: NONE SEEN WBC/hpf (ref 0–5)
pH: 7 (ref 5.0–8.0)

## 2023-08-11 LAB — PREGNANCY, URINE: Preg Test, Ur: NEGATIVE

## 2023-08-11 LAB — URINALYSIS, MICROSCOPIC (REFLEX): RBC / HPF: NONE SEEN RBC/hpf (ref 0–5)

## 2023-08-11 LAB — CBC
HCT: 40.7 % (ref 36.0–46.0)
Hemoglobin: 13.5 g/dL (ref 12.0–15.0)
MCH: 30.3 pg (ref 26.0–34.0)
MCHC: 33.2 g/dL (ref 30.0–36.0)
MCV: 91.5 fL (ref 80.0–100.0)
Platelets: 381 10*3/uL (ref 150–400)
RBC: 4.45 MIL/uL (ref 3.87–5.11)
RDW: 13.1 % (ref 11.5–15.5)
WBC: 8.7 10*3/uL (ref 4.0–10.5)
nRBC: 0 % (ref 0.0–0.2)

## 2023-08-11 LAB — LIPASE, BLOOD: Lipase: 24 U/L (ref 11–51)

## 2023-08-11 LAB — OCCULT BLOOD X 1 CARD TO LAB, STOOL: Fecal Occult Bld: NEGATIVE

## 2023-08-11 MED ORDER — NITROFURANTOIN MONOHYD MACRO 100 MG PO CAPS: 100.0000 mg | ORAL_CAPSULE | Freq: Two times a day (BID) | ORAL | 0 refills | Status: AC

## 2023-08-11 MED ORDER — NITROFURANTOIN MONOHYD MACRO 100 MG PO CAPS
100.0000 mg | ORAL_CAPSULE | Freq: Once | ORAL | Status: AC
  Administered 2023-08-11: 100 mg via ORAL
  Filled 2023-08-11: qty 1

## 2023-08-11 NOTE — Telephone Encounter (Signed)
 Chief Complaint: vaginal pain Symptoms: vaginal pain, hip pain, rectal bleeding, nausea Frequency: vaginal pain worsening since January Pertinent Negatives: Patient denies fever, CP, SOB, palpitations, vomiting Disposition: [x] ED /[] Urgent Care (no appt availability in office) / [] Appointment(In office/virtual)/ []  [facility]/ [] Home Care/ [] Refused Recommended Disposition /[] [facility]/ []  Follow-up with PCP Additional Notes: Pt reports 10/10 vaginal pain worsening since January. Pt endorses lower back and lower abdominal pain and states it hurts to urinate. Pt also endorses pain to the R hip with what appears like a bulging vein that is warm to the touch. Pt states when she is on her period she is unable to walk due to the pain. Pt reports bloody stools for 1 wk with nausea. Pt is not-established. RN advised pt she needs go to to the ED for evaluation of symptoms. RN educated pt on why the ED is more appropriate than an UC. Pt verbalized understanding and states she is close to the ED and will go. RN advised pt she can call us  back to scheduled a new pt office visit. RN advised pt if she worsens before she arrives to the ED she should call [phone]. Pt verbalized understanding.      Copied from CRM [id number]. Topic: Clinical - Red Word Triage >> Aug 11, 2023 11:34 AM Juluis Ok wrote: [facility] that prompted transfer to Nurse Triage: vaginal pain Reason for Disposition  Patient sounds very sick or weak to the triager  Answer Assessment - Initial Assessment Questions 1. SYMPTOM: "What's the main symptom you're concerned about?" (e.g., pain, itching, dryness)     Vaginal pain 2. LOCATION: "Where is the pain located?" (e.g., inside/outside, left/right)     Vagina and R hip  3. ONSET: "When did the pain start?"     January  4. PAIN: "Is there any pain?" If Yes, ask: "How bad is it?" (Scale: 1-10; mild, moderate, severe)   -  MILD (1-3): Doesn't interfere with normal activities.     -  MODERATE (4-7): Interferes with normal activities (e.g., work or school) or awakens from sleep.     -  SEVERE (8-10): Excruciating pain, unable to do any normal activities.     10/10 pain in the lower back, abdomen, and vagina (in and out) 5. ITCHING: "Is there any itching?" If Yes, ask: "How bad is it?" (Scale: 1-10; mild, moderate, severe)     No  6. CAUSE: "What do you think is causing the discharge?" "Have you had the same problem before? What happened then?"     Not sure  7. OTHER SYMPTOMS: "Do you have any other symptoms?" (e.g., fever, itching, vaginal bleeding, pain with urination, injury to genital area, vaginal foreign body)    Endorses heat and a "knot" under the skin to her R hip. Endorses a bulging vein. Denies color change or redness. Endorses numbness and tingling to the R hip/leg that comes and goes. Numbness to vaginal area as well. Denies of DVT. States she is unable to walk when she is on her period d/t the pain. Denies CP or SOB. Endorses nausea.  Endorses yellow/green discharge.  Denies fever or chills. Endorses lower abdominal and back pain w/ nausea  Denies burning but states it hurts to urinate, pt endorses she has been bleeding with Bms for 1 wk, denies hx of that. Blood mixed in with stool and turns the water pink. Endorses dizziness and weakness.  Protocols used: Vaginal Symptoms-A-AH

## 2023-08-11 NOTE — ED Provider Notes (Signed)
 [place] [facility] Provider Note   CSN: [id number] Arrival date & time: 08/11/23  1528     History  Chief Complaint  Patient presents with   Groin Pain   Rectal Bleeding    Lorraine Beard is a 33 y.o. female with no significant past medical history presents with concern for lower pelvic pain that occurs around her menstrual cycle.  This has been ongoing for several months now.  Denies any pain when off of her menstrual cycle.  Reports her PCP did a pelvic exam which was unremarkable.  She request a ultrasound for further evaluation as she states she cannot get into her gynecologist for another month. Denies any fevers, chills, hematuria, or increased frequency.  She does report dysuria for about the past month.  She also reports having some bright red blood in her stool for the past couple of days.  She reports she has been straining due to issues with constipation.  Denies any melanotic stool, dizziness, weakness.   Groin Pain  Rectal Bleeding      Home Medications Prior to Admission medications   Medication Sig Start Date End Date Taking? Authorizing Provider  nitrofurantoin, macrocrystal-monohydrate, (MACROBID) 100 MG capsule Take 1 capsule (100 mg total) by mouth 2 (two) times daily for 5 days. 08/11/23 08/16/23 Yes Rexie Catena, PA-C  meloxicam  (MOBIC ) 7.5 MG tablet Take 1 tablet (7.5 mg total) by mouth daily as needed for pain. 11/19/19   Mesner, Reymundo Caulk, MD  methocarbamol  (ROBAXIN ) 500 MG tablet Take 1 tablet (500 mg total) by mouth 2 (two) times daily. 08/16/21   Darletta Ehrich, PA-C  Multiple Vitamin CHEW Chew by mouth.    [provider]  naproxen  (NAPROSYN ) 375 MG tablet Take 1 tablet (375 mg total) by mouth 2 (two) times daily. 08/16/21   Angelyn Kennel M, PA-C  predniSONE  (DELTASONE ) 20 MG tablet 3 tabs po daily x 3 days, then 2 tabs x 3 days, then 1.5 tabs x 3 days, then 1 tab x 3 days, then 0.5 tabs x 3 days 11/19/19    Mesner, Reymundo Caulk, MD      Allergies    Peanut-containing drug products and Food    Review of Systems   Review of Systems  Gastrointestinal:  Positive for hematochezia.    Physical Exam Updated Vital Signs BP (!) 125/90 (BP Location: Right Arm)   Pulse (!) 57   Temp 98.5 F (36.9 C) (Oral)   Resp 18   Ht 5' (1.524 m)   Wt 90.3 kg   LMP 07/28/2023   SpO2 100%   BMI 38.86 kg/m  Physical Exam Vitals and nursing note reviewed.  Constitutional:      General: She is not in acute distress.    Appearance: She is well-developed.  HENT:     Head: Normocephalic and atraumatic.  Eyes:     Conjunctiva/sclera: Conjunctivae normal.  Cardiovascular:     Rate and Rhythm: Normal rate and regular rhythm.     Heart sounds: No murmur heard. Pulmonary:     Effort: Pulmonary effort is normal. No respiratory distress.     Breath sounds: Normal breath sounds.  Abdominal:     Palpations: Abdomen is soft.     Tenderness: There is no abdominal tenderness.     Comments: Abdomen is soft and nontender to palpation  Genitourinary:    Comments: Patient declines pelvic exam  RN chaperone present for rectal exam. Patient has external nonthrombosed hemorrhoid.  No  internal hemorrhoid noted.  Stool brown appearing, not melanotic Musculoskeletal:        General: No swelling.     Cervical back: Neck supple.  Skin:    General: Skin is warm and dry.     Capillary Refill: Capillary refill takes less than 2 seconds.  Neurological:     Mental Status: She is alert.  Psychiatric:        Mood and Affect: Mood normal.     ED Results / Procedures / Treatments   Labs (all labs ordered are listed, but only abnormal results are displayed) Labs Reviewed  URINALYSIS, ROUTINE W REFLEX MICROSCOPIC - Abnormal; Notable for the following components:      Result Value   Leukocytes,Ua SMALL (*)    All other components within normal limits  URINALYSIS, MICROSCOPIC (REFLEX) - Abnormal; Notable for the following  components:   Bacteria, UA FEW (*)    All other components within normal limits  URINE CULTURE  LIPASE, BLOOD  COMPREHENSIVE METABOLIC PANEL WITH GFR  CBC  PREGNANCY, URINE  OCCULT BLOOD X 1 CARD TO LAB, STOOL  URINALYSIS, W/ REFLEX TO CULTURE (INFECTION SUSPECTED)  POC OCCULT BLOOD, ED    EKG None  Radiology US  PELVIC COMPLETE WITH TRANSVAGINAL Result Date: 08/11/2023 CLINICAL DATA:  Pelvic pain. Right groin bulge with painful periods. History of fibroids. LMP 07/27/2023. EXAM: TRANSABDOMINAL AND TRANSVAGINAL ULTRASOUND OF PELVIS TECHNIQUE: Both transabdominal and transvaginal ultrasound examinations of the pelvis were performed. Transabdominal technique was performed for global imaging of the pelvis including uterus, ovaries, adnexal regions, and pelvic cul-de-sac. It was necessary to proceed with endovaginal exam following the transabdominal exam to visualize the endometrium and ovaries to better advantage. COMPARISON:  Abdominopelvic CT 08/16/2021. Pelvic ultrasound 02/20/2010. FINDINGS: Uterus Measurements: Approximately 16.2 x 9.2 x 7.8 cm = volume: 607.8 mL. Several uterine fibroids are noted. Largest fibroid is a right fundal fibroid measuring approximately 8.8 x 6.1 x 6.9 cm. Other smaller fibroids are noted, including an anterior fundal fibroid measuring up to 3.0 cm and a right posterior fibroid measuring 3.2 cm. Endometrium Thickness: 9 mm.  No focal abnormality visualized. Right ovary Measurements: 4.0 x 4.0 x 4.0 cm = volume: 33 mL. Simple appearing cyst measuring up to 3.1 cm. No suspicious adnexal findings. Normal blood flow with color Doppler. Left ovary Measurements: 3.3 x 2.1 x 2.6 cm = volume: 9.0 mL. Normal appearance/no adnexal mass. Normal blood flow with color Doppler. Other findings Trace free pelvic fluid. IMPRESSION: 1. Enlarged fibroid uterus. 2. Right ovarian benign functional cyst measuring 3.1 cm. No follow-up imaging is recommended. Reference: Radiology 2019  Nov;293(2):359-371 3. No acute findings identified. Electronically Signed   By: Elmon Hagedorn M.D.   On: 08/11/2023 19:01    Procedures Procedures    Medications Ordered in ED Medications  nitrofurantoin (macrocrystal-monohydrate) (MACROBID) capsule 100 mg (100 mg Oral Given 08/11/23 1927)    ED Course/ Medical Decision Making/ A&P                                 Medical Decision Making Amount and/or Complexity of Data Reviewed Labs: ordered. Radiology: ordered.  Risk Prescription drug management.     Differential diagnosis includes but is not limited to menstrual cramping, leiomyoma, endometriosis, uterine cyst, hemorrhoid, upper GI bleed  ED Course:  Upon initial evaluation, patient is well-appearing, stable vital signs.  Reporting lower pelvic pain that occurs with her menstrual cycle  that has been ongoing for several months.  No pain when off of her menstrual cycle.  Abdomen is soft nontender currently.  She denies pelvic exam.  Given pain associated with menstrual cycle, suspect this may be a fibroid versus endometriosis versus other.  Low concern for acute intra-abdominal pathology at this time given abdomen soft nontender, CBC, lipase, and CMP unremarkable.  She does have leukocytes on urine, and given dysuria, will treat for possible urinary tract infection.  No concern for pyelonephritis at this time given no back pain or leukocytosis.  Urine culture added on.  Per patient request, will obtain ultrasound today for further evaluation.  Patient also reports some bright red blood per rectum over the past couple days with straining.  On exam, has nonthrombosed external hemorrhoid.  Suspect this is the cause of her bleeding.  Hemoccult negative, no melanotic appearing stool, no anemia, no concern for acute GI bleed or upper GI bleed at this time.  Labs Ordered: I Ordered, and personally interpreted labs.  The pertinent results include:  CBC, CMP, lipase all within normal  limits Hemoccult negative Pregnancy negative Urinalysis without signs of infection  Imaging Studies ordered: I ordered imaging studies including pelvic ultrasound I independently visualized the imaging with scope of interpretation limited to determining acute life threatening conditions related to emergency care. Imaging showed multiple uterine fibroids and right ovarian cyst I agree with the radiologist interpretation   Medications Given: Nitrofurantoin for UTI  Upon re-evaluation, patient still well-appearing, stable vital signs.  We discussed her ultrasound results, and that the pain around her period is likely due to fibroids.  Stable and appropriate for discharge home.    Impression: Acute cystitis Uterine fibroids  Disposition:  The patient was discharged home with instructions to take 5-day course of nitrofurantoin as prescribed.  Follow-up with her gynecologist as soon as possible for further management of her fibroid pain.  May use ibuprofen  as needed for pain. Return precautions given.   This chart was dictated using voice recognition software, Dragon. Despite the best efforts of this provider to proofread and correct errors, errors may still occur which can change documentation meaning.          Final Clinical Impression(s) / ED Diagnoses Final diagnoses:  Acute cystitis without hematuria  Uterine leiomyoma, unspecified location    Rx / DC Orders ED Discharge Orders          Ordered    nitrofurantoin, macrocrystal-monohydrate, (MACROBID) 100 MG capsule  2 times daily        08/11/23 1925              Rexie Catena, PA-C 08/11/23 1933    Carin Charleston, MD 08/11/23 2010

## 2023-08-11 NOTE — Discharge Instructions (Addendum)
 You have been prescribed nitrofurantoin (Macrobid) which is an antibiotic to treat for possible urinary tract infection. Take this antibiotic 2 times a day for the next 5 days. Take the full course of your antibiotic even if you start feeling better. Antibiotics may cause you to have diarrhea.  Your kidney, liver, and pancreas labs were normal today.  Your blood counts and electrolytes were normal.  Your pregnancy test was negative.  The pain in your lower abdomen around your period is likely due to pain from your fibroids.  I have included your ultrasound results below for your reference.  Please follow-up with your gynecologist as soon as possible for further management.  You may use up to 600mg  ibuprofen  every 6 hours as needed for pain.  Do not exceed 2.4g of ibuprofen  per day.  Return to the ER for any fevers, severe worsening of your pain, other emergent concerns.  " EXAM:  TRANSABDOMINAL AND TRANSVAGINAL ULTRASOUND OF PELVIS    TECHNIQUE:  Both transabdominal and transvaginal ultrasound examinations of the  pelvis were performed. Transabdominal technique was performed for  global imaging of the pelvis including uterus, ovaries, adnexal  regions, and pelvic cul-de-sac. It was necessary to proceed with  endovaginal exam following the transabdominal exam to visualize the  endometrium and ovaries to better advantage.    COMPARISON:  Abdominopelvic CT 08/16/2021. Pelvic ultrasound  02/20/2010.    FINDINGS:  Uterus    Measurements: Approximately 16.2 x 9.2 x 7.8 cm = volume: 607.8 mL.  Several uterine fibroids are noted. Largest fibroid is a right  fundal fibroid measuring approximately 8.8 x 6.1 x 6.9 cm. Other  smaller fibroids are noted, including an anterior fundal fibroid  measuring up to 3.0 cm and a right posterior fibroid measuring 3.2  cm.    Endometrium    Thickness: 9 mm.  No focal abnormality visualized.    Right ovary    Measurements: 4.0 x 4.0 x 4.0 cm =  volume: 33 mL. Simple appearing  cyst measuring up to 3.1 cm. No suspicious adnexal findings. Normal  blood flow with color Doppler.    Left ovary    Measurements: 3.3 x 2.1 x 2.6 cm = volume: 9.0 mL. Normal  appearance/no adnexal mass. Normal blood flow with color Doppler.    Other findings    Trace free pelvic fluid.    IMPRESSION:  1. Enlarged fibroid uterus.  2. Right ovarian benign functional cyst measuring 3.1 cm. No  follow-up imaging is recommended. Reference: Radiology 2019  Nov;293(2):359-371  3. No acute findings identified.      Electronically Signed    By: Elmon Hagedorn M.D.    On: 08/11/2023 19:01  "

## 2023-08-11 NOTE — ED Triage Notes (Addendum)
 Pt arrives ambulatory to ED with pain to right groin area states that she has had this pain before. Pain come with menstrual cycle last period ended 2 weeks ago. Pt also endorses blood in stool for last 4 days.

## 2023-08-13 LAB — URINE CULTURE

## 2023-08-21 DIAGNOSIS — N644 Mastodynia: Secondary | ICD-10-CM | POA: Diagnosis not present

## 2024-02-27 ENCOUNTER — Emergency Department (HOSPITAL_BASED_OUTPATIENT_CLINIC_OR_DEPARTMENT_OTHER)
Admission: EM | Admit: 2024-02-27 | Discharge: 2024-02-28 | Disposition: A | Discharge status: Home / Self Care | Attending: Emergency Medicine | Admitting: Emergency Medicine

## 2024-02-27 ENCOUNTER — Encounter (HOSPITAL_BASED_OUTPATIENT_CLINIC_OR_DEPARTMENT_OTHER): Payer: Self-pay | Admitting: Emergency Medicine

## 2024-02-27 ENCOUNTER — Other Ambulatory Visit: Payer: Self-pay

## 2024-02-27 ENCOUNTER — Emergency Department (HOSPITAL_BASED_OUTPATIENT_CLINIC_OR_DEPARTMENT_OTHER)

## 2024-02-27 DIAGNOSIS — S161XXA Strain of muscle, fascia and tendon at neck level, initial encounter: Secondary | ICD-10-CM | POA: Insufficient documentation

## 2024-02-27 DIAGNOSIS — W228XXA Striking against or struck by other objects, initial encounter: Secondary | ICD-10-CM | POA: Insufficient documentation

## 2024-02-27 DIAGNOSIS — Y9389 Activity, other specified: Secondary | ICD-10-CM | POA: Insufficient documentation

## 2024-02-27 DIAGNOSIS — Z91010 Allergy to peanuts: Secondary | ICD-10-CM | POA: Diagnosis not present

## 2024-02-27 DIAGNOSIS — M542 Cervicalgia: Secondary | ICD-10-CM | POA: Diagnosis not present

## 2024-02-27 LAB — CBC
HCT: 39.5 % (ref 36.0–46.0)
Hemoglobin: 13.1 g/dL (ref 12.0–15.0)
MCH: 29.9 pg (ref 26.0–34.0)
MCHC: 33.2 g/dL (ref 30.0–36.0)
MCV: 90.2 fL (ref 80.0–100.0)
Platelets: 380 K/uL (ref 150–400)
RBC: 4.38 MIL/uL (ref 3.87–5.11)
RDW: 13.1 % (ref 11.5–15.5)
WBC: 9.7 K/uL (ref 4.0–10.5)
nRBC: 0 % (ref 0.0–0.2)

## 2024-02-27 LAB — BASIC METABOLIC PANEL WITH GFR
Anion gap: 9 (ref 5–15)
BUN: 16 mg/dL (ref 6–20)
CO2: 24 mmol/L (ref 22–32)
Calcium: 8.7 mg/dL — ABNORMAL LOW (ref 8.9–10.3)
Chloride: 104 mmol/L (ref 98–111)
Creatinine, Ser: 1.01 mg/dL — ABNORMAL HIGH (ref 0.44–1.00)
GFR, Estimated: >60 mL/min (ref >60)
Glucose, Bld: 101 mg/dL — ABNORMAL HIGH (ref 70–99)
Potassium: 4 mmol/L (ref 3.5–5.1)
Sodium: 137 mmol/L (ref 135–145)

## 2024-02-27 MED ORDER — CYCLOBENZAPRINE HCL 10 MG PO TABS
10.0000 mg | ORAL_TABLET | Freq: Once | ORAL | Status: AC
  Administered 2024-02-27: 10 mg via ORAL
  Filled 2024-02-27: qty 1

## 2024-02-27 MED ORDER — KETOROLAC TROMETHAMINE 30 MG/ML IJ SOLN
30.0000 mg | Freq: Once | INTRAMUSCULAR | Status: AC
  Administered 2024-02-27: 30 mg via INTRAVENOUS
  Filled 2024-02-27: qty 1

## 2024-02-27 MED ORDER — IOHEXOL 350 MG/ML SOLN
75.0000 mL | Freq: Once | INTRAVENOUS | Status: AC | PRN
  Administered 2024-02-27: 75 mL via INTRAVENOUS

## 2024-02-27 MED ORDER — LIDOCAINE 5 % EX PTCH
1.0000 | MEDICATED_PATCH | CUTANEOUS | Status: DC
  Administered 2024-02-27: 1 via TRANSDERMAL
  Filled 2024-02-27: qty 1
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
## 2024-02-27 NOTE — ED Notes (Signed)
 Patient reported that when she had the IV contrast she felt like her throat was closing.  Symptoms are resolving. Patient is in NAD, VSS. MD notified.

## 2024-02-28 MED ORDER — CYCLOBENZAPRINE HCL 10 MG PO TABS: 10.0000 mg | ORAL_TABLET | Freq: Three times a day (TID) | ORAL | 0 refills | Status: AC | PRN

## 2024-02-28 NOTE — Discharge Instructions (Signed)
 Take ibuprofen  600 mg every 6 hours as needed for pain.  Begin taking Flexeril as prescribed as needed for pain not relieved with ibuprofen .  Follow-up with primary doctor if symptoms persist, and return to the ER if symptoms significantly worsen or change.
